# Patient Record
Sex: Male | Born: 1955 | Race: White | Hispanic: No | Marital: Single | Smoking: Never smoker
Health system: Southern US, Community
[De-identification: ages and names within clinical notes are randomized; demographics above are authoritative.]

## PROBLEM LIST (undated history)

## (undated) ENCOUNTER — Emergency Department (HOSPITAL_COMMUNITY): Admission: EM | Payer: Non-veteran care | Source: Home / Self Care

## (undated) DIAGNOSIS — I10 Essential (primary) hypertension: Secondary | ICD-10-CM

## (undated) DIAGNOSIS — C159 Malignant neoplasm of esophagus, unspecified: Secondary | ICD-10-CM

---

## 2005-02-22 ENCOUNTER — Emergency Department (HOSPITAL_COMMUNITY): Admission: EM | Admit: 2005-02-22 | Discharge: 2005-02-22 | Payer: Self-pay | Admitting: Emergency Medicine

## 2005-02-24 ENCOUNTER — Emergency Department (HOSPITAL_COMMUNITY): Admission: EM | Admit: 2005-02-24 | Discharge: 2005-02-25 | Payer: Self-pay | Admitting: Emergency Medicine

## 2008-12-09 ENCOUNTER — Emergency Department (HOSPITAL_COMMUNITY): Admission: EM | Admit: 2008-12-09 | Discharge: 2008-12-09 | Payer: Self-pay | Admitting: Emergency Medicine

## 2010-07-04 LAB — CBC
HCT: 34.4 % — ABNORMAL LOW (ref 39.0–52.0)
Hemoglobin: 11.9 g/dL — ABNORMAL LOW (ref 13.0–17.0)
MCHC: 34.5 g/dL (ref 30.0–36.0)
MCV: 91.6 fL (ref 78.0–100.0)
Platelets: 280 10*3/uL (ref 150–400)
RBC: 3.75 MIL/uL — ABNORMAL LOW (ref 4.22–5.81)
RDW: 13.2 % (ref 11.5–15.5)
WBC: 18.1 10*3/uL — ABNORMAL HIGH (ref 4.0–10.5)

## 2010-07-04 LAB — URINALYSIS, ROUTINE W REFLEX MICROSCOPIC
Bilirubin Urine: NEGATIVE
Glucose, UA: 100 mg/dL — AB
Hgb urine dipstick: NEGATIVE
Ketones, ur: NEGATIVE mg/dL
Nitrite: NEGATIVE
Protein, ur: 30 mg/dL — AB
Specific Gravity, Urine: 1.02 (ref 1.005–1.030)
Urobilinogen, UA: 0.2 mg/dL (ref 0.0–1.0)
pH: 5.5 (ref 5.0–8.0)

## 2010-07-04 LAB — URINE MICROSCOPIC-ADD ON

## 2010-07-04 LAB — COMPREHENSIVE METABOLIC PANEL
ALT: 54 U/L — ABNORMAL HIGH (ref 0–53)
AST: 32 U/L (ref 0–37)
Albumin: 3.2 g/dL — ABNORMAL LOW (ref 3.5–5.2)
Alkaline Phosphatase: 102 U/L (ref 39–117)
BUN: 32 mg/dL — ABNORMAL HIGH (ref 6–23)
CO2: 26 mEq/L (ref 19–32)
Calcium: 8.8 mg/dL (ref 8.4–10.5)
Chloride: 95 mEq/L — ABNORMAL LOW (ref 96–112)
Creatinine, Ser: 3.42 mg/dL — ABNORMAL HIGH (ref 0.4–1.5)
GFR calc Af Amer: 23 mL/min — ABNORMAL LOW (ref >60)
GFR calc non Af Amer: 19 mL/min — ABNORMAL LOW (ref >60)
Glucose, Bld: 124 mg/dL — ABNORMAL HIGH (ref 70–99)
Potassium: 3.5 mEq/L (ref 3.5–5.1)
Sodium: 132 mEq/L — ABNORMAL LOW (ref 135–145)
Total Bilirubin: 0.6 mg/dL (ref 0.3–1.2)
Total Protein: 6.9 g/dL (ref 6.0–8.3)

## 2010-07-04 LAB — DIFFERENTIAL
Basophils Absolute: 0 10*3/uL (ref 0.0–0.1)
Basophils Relative: 0 % (ref 0–1)
Eosinophils Absolute: 0 10*3/uL (ref 0.0–0.7)
Eosinophils Relative: 0 % (ref 0–5)
Lymphocytes Relative: 8 % — ABNORMAL LOW (ref 12–46)
Lymphs Abs: 1.4 10*3/uL (ref 0.7–4.0)
Monocytes Absolute: 1.9 10*3/uL — ABNORMAL HIGH (ref 0.1–1.0)
Monocytes Relative: 11 % (ref 3–12)
Neutro Abs: 14.8 10*3/uL — ABNORMAL HIGH (ref 1.7–7.7)
Neutrophils Relative %: 81 % — ABNORMAL HIGH (ref 43–77)

## 2016-03-30 HISTORY — PX: PROSTATECTOMY: SHX69

## 2018-02-27 HISTORY — PX: PEG PLACEMENT: SHX5437

## 2018-03-07 NOTE — Progress Notes (Signed)
GI Location of Tumor / Histology: Esophagus  Drew Vega presented with complaints of new onset of difficulty/trouble swallowing that began 6 weeks ago that he did not have before that occurs daily.  He described difficulties swallowing solids only, not liquids, particularly bread and meats.  Solids get stuck at his distal esophagus that requires regurgitation to clear.  He denies any difficulty swallowing liquids.  EGD 02/17/2018: malignant mass extending from the GE junction to 35 cm, very necrotic appearing, sig luminal narrowing but able to pass scope, mass possible submucosal in cardia appears extension.  Colonoscopy 09/2007: Perianal and digital rectal examinations were normal.  One 5 mm sessile sigmoid colon polyp, resected/retrieved.  Two 6-8 mm sessile sigmoid/distal sigmoid colon polyps, resected/retreived.    Biopsies of Gastric Cardia and Esophagus mass 02/17/2018  A. Gastric Cardia Mass, biopsies -Poorly differentiated adenocarcinoma, at least intramucosal. -Chronic active gastritis; bacteria morphologically consistent with H. Pylori. B. Esophageal mass, biopsies: -Gastric type mucosa with foci of poorly differentiated adenocarcinoma, at least intramucosal. -fragments of gastric type mucosa with chronic active gastritis. -necro-inflammatory debris with bacteria. -one fragment of squamous mucosa with parakeratosis.  Past/Anticipated interventions by GI, if any: Dr. Fonnie Birkenhead 02/01/2018 -Upper GI endoscopy examination secondary to dysphagia. -Colonoscopy examination for colorectal cancer screening.   Previous -Bowel prep for the colonoscopy 09/2007 was fair.  Reccommed repeat colonoscopy in 3 years because of poor bowel preparation and for surveillance co colon polyps.  Repeat colonoscopy was not done. -EGD done 02/17/2018  Past/Anticipated interventions by surgeon, if any:   Past/Anticipated interventions by medical oncology, if any:   Weight changes, if any: Roughly 25  pounds down in about 8 weeks.  Supplementing with protein shakes  Bowel/Bladder complaints, if any: No bowel movement in about 5 days.  Nausea / Vomiting, if any: Not now, with diet change.  Limiting the amount he takes in at a time.  Pain issues, if any: No  Any blood per rectum: No  BP 130/86 (BP Location: Left Arm, Patient Position: Sitting)   Pulse 82   Temp 98.8 F (37.1 C) (Oral)   Resp 18   Ht 5\' 7"  (1.702 m)   Wt 160 lb 2 oz (72.6 kg)   SpO2 100%   BMI 25.08 kg/m    Wt Readings from Last 3 Encounters:  03/08/18 160 lb 2 oz (72.6 kg)   SAFETY ISSUES:  Prior radiation? No  Pacemaker/ICD? No  Possible current pregnancy? No  Is the patient on methotrexate? No  Current Complaints/Details:

## 2018-03-08 ENCOUNTER — Encounter: Payer: Self-pay | Admitting: Radiation Oncology

## 2018-03-08 ENCOUNTER — Other Ambulatory Visit: Payer: Self-pay | Admitting: Radiation Oncology

## 2018-03-08 ENCOUNTER — Ambulatory Visit
Admission: RE | Admit: 2018-03-08 | Discharge: 2018-03-08 | Disposition: A | Discharge status: Home / Self Care | Payer: No Typology Code available for payment source | Source: Ambulatory Visit | Attending: Radiation Oncology | Admitting: Radiation Oncology

## 2018-03-08 ENCOUNTER — Other Ambulatory Visit: Payer: Self-pay

## 2018-03-08 VITALS — BP 130/86 | HR 82 | Temp 98.8°F | Resp 18 | Ht 67.0 in | Wt 160.1 lb

## 2018-03-08 DIAGNOSIS — Z79899 Other long term (current) drug therapy: Secondary | ICD-10-CM | POA: Insufficient documentation

## 2018-03-08 DIAGNOSIS — C155 Malignant neoplasm of lower third of esophagus: Secondary | ICD-10-CM

## 2018-03-08 DIAGNOSIS — Z7982 Long term (current) use of aspirin: Secondary | ICD-10-CM | POA: Insufficient documentation

## 2018-03-08 NOTE — Progress Notes (Signed)
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   HISTORY OF PRESENT ILLNESS: Drew Vega is a 62 y.o. male seen at the request of Dr. Fonnie Birkenhead in GI at the [place] [facility] in [place] for a newly diagnosed esophageal cancer. The patient has been having dysphagia and odynophagia for the last 6 weeks, and has lost about 25 pounds in the last two months. He underwent endoscopy and colonoscopy on 02/17/18. He had a benign appearing colon polyp, but on EGD had a large, mass in the esphagus that was necrotic appearing and significant luminal narrowing was noted. There was extension into the gastric cardia. A biopsy revealed a poorly differentiated adenocarcinoma. He underwent a CT C/A/P on 03/03/18 that revealed a 5.2 x 5.4 cm mass in the distal eosphagus and extends to the gastric cardia. It narrows the lumen of the esophagus, and the esophagus proximal to the tumor measures up to 3.3 cm. There was a thoracic ectasia measuring 4 x 3.9 cm, and changes in the left chest that appear to be consistent with prior service related injury with metal fragments in the chest wall and lung. There was a 3.4 x 3.7 x 4 cm gastrohepatic necrotic node. There were also numerous hypoenhancing liver lesions many appearing necrotic and measure up to 2.1 x 2.3 cm in the right lobe. He has a right kidney stone and left renal cysts noted. He comes today to discuss options of treatment for the cancer. He did have to undergo repeat endoscopy on 03/04/18 due to retained food and regurgitation.   PREVIOUS RADIATION THERAPY: No   PAST MEDICAL HISTORY: History reviewed. No pertinent past medical history.     PAST SURGICAL HISTORY: Past  Surgical History:  Procedure Laterality Date  . PROSTATECTOMY  2018     FAMILY HISTORY:  Family History  Problem Relation Age of Onset  . Throat cancer Mother      SOCIAL HISTORY:  reports that he has never smoked. He has never used smokeless tobacco. He reports that he drinks alcohol.   ALLERGIES: Patient has no known allergies.   MEDICATIONS:  Current Outpatient Medications  Medication Sig Dispense Refill  . AMLODIPINE BESYLATE PO Take 10 mg by mouth daily.    Marland Kitchen aspirin EC 81 MG tablet Take 81 mg by mouth daily.    Marland Kitchen atenolol (TENORMIN) 100 MG tablet Take 100 mg by mouth daily.    . chlorthalidone (HYGROTON) 25 MG tablet Take 25 mg by mouth daily. Take one half tablet daily.    Marland Kitchen doxepin (SINEQUAN) 25 MG capsule Take 25 mg by mouth. Take one capsule by mouth at bedtime along with melatonin for sleep.    Marland Kitchen lisinopril (PRINIVIL,ZESTRIL) 40 MG tablet Take 40 mg by mouth daily. Take one half tablet by mouth in the morning.    . Omega-3 Fatty Acids (FISH OIL) 1000 MG CAPS Take 1,000 mg by mouth daily.    Marland Kitchen omeprazole (PRILOSEC) 20 MG capsule Take 20 mg by mouth daily.    . sildenafil (VIAGRA) 100 MG tablet Take 100 mg by mouth daily as needed for erectile dysfunction.     No current facility-administered medications for this encounter.  REVIEW OF SYSTEMS: On review of systems, the patient reports that he is doing okay but is very fatigued, and has lost about 25 pounds of weight in the last 8 weeks. He denies any chest pain, shortness of breath, cough, fevers, chills, night sweats. He reports he is only able to swallow liquids at this time. He wishes to avoid a feeding tube if possible. He denies any bowel or bladder disturbances, and denies abdominal pain, nausea or vomiting. His last episode of regurgitation was prior to his endoscopy on 03/04/18. He denies any new musculoskeletal or joint aches or pains. A complete review of systems is obtained and is otherwise negative.      PHYSICAL EXAM:  Wt Readings from Last 3 Encounters:  03/08/18 160 lb 2 oz (72.6 kg)   Temp Readings from Last 3 Encounters:  03/08/18 98.8 F (37.1 C) (Oral)   BP Readings from Last 3 Encounters:  03/08/18 130/86   Pulse Readings from Last 3 Encounters:  03/08/18 82   Pain Assessment Pain Score: 0-No pain/10  In general this is a thin appearing caucasian male in no acute distress. He is alert and oriented x4 and appropriate throughout the examination. HEENT reveals that the patient is normocephalic, atraumatic. EOMs are intact. Skin is intact without any evidence of gross lesions. Cardiopulmonary assessment is negative for acute distress and he exhibits normal effort.    ECOG = 1  0 - Asymptomatic (Fully active, able to carry on all predisease activities without restriction)  1 - Symptomatic but completely ambulatory (Restricted in physically strenuous activity but ambulatory and able to carry out work of a light or sedentary nature. For example, light housework, office work)  2 - Symptomatic, <50% in bed during the day (Ambulatory and capable of all self care but unable to carry out any work activities. Up and about more than 50% of waking hours)  3 - Symptomatic, >50% in bed, but not bedbound (Capable of only limited self-care, confined to bed or chair 50% or more of waking hours)  4 - Bedbound (Completely disabled. Cannot carry on any self-care. Totally confined to bed or chair)  5 - Death   Drew Vega MM, Drew Vega, Drew Vega, et al. [id number]). "Toxicity and response criteria of the [facility]". [place] Oncol. 5 (6): 649-55    LABORATORY DATA:  Lab Results  Component Value Date   WBC 18.1 (H) 12/09/2008   HGB 11.9 (L) 12/09/2008   HCT 34.4 (L) 12/09/2008   MCV 91.6 12/09/2008   PLT 280 12/09/2008   Lab Results  Component Value Date   NA 132 (L) 12/09/2008   K 3.5 12/09/2008   CL 95 (L) 12/09/2008   CO2 26 12/09/2008   Lab Results   Component Value Date   ALT 54 (H) 12/09/2008   AST 32 12/09/2008   ALKPHOS 102 12/09/2008   BILITOT 0.6 12/09/2008      RADIOGRAPHY: No results found.     IMPRESSION/PLAN: 1. 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 Dr. Lisbeth Renshaw discusses the pathology findings and reviews the nature of esophageal cancers. We have ordered a PET scan to further evaluate for metastatic disease. I've sent a message to Dr. Neita Carp in medical oncology at the [facility] to determine if they desire additional imaging or biopsy to confirm the suspected liver disease. Dr. Lisbeth Renshaw reviews the rationale to consider palliative radiotherapy alone versus a 3-4 week course of palliative radiotherapy with ongoing chemotherapy. We  discussed the risks, benefits, short, and long term effects of radiotherapy, and the patient is interested in proceeding. Written consent is obtained and placed in the chart, a copy was provided to the patient. We will determine his overall course after speaking with Dr. Neita Carp. The patient is in agreement and  We will be in touch with him to coordinate simulation. 2. Weight loss. The patient is interested in avoiding a feeding tube at this time. We can revisit this if needed. The patient is in agreement.  In a visit lasting 60 minutes, greater than 50% of the time was spent face to face discussing his case, and coordinating the patient's care.  The above documentation reflects my direct findings during this shared patient visit. Please see the separate note by Dr. Lisbeth Renshaw on this date for the remainder of the patient's plan of care.    Carola Rhine, PAC

## 2018-03-09 ENCOUNTER — Telehealth: Payer: Self-pay | Admitting: *Deleted

## 2018-03-09 NOTE — Telephone Encounter (Signed)
CALLED PATIENT TO INFORM OF PET SCAN FOR 03-21-18 - ARRIVAL TIME- 11:30 AM @ [facility], PT. TO BE NPO- 6 HRS. PRIOR TO TEST, SPOKE WITH PATIENT AND HE IS AWARE OF THIS TEST

## 2018-03-11 ENCOUNTER — Ambulatory Visit
Admission: RE | Admit: 2018-03-11 | Discharge: 2018-03-11 | Disposition: A | Discharge status: Home / Self Care | Payer: Self-pay | Source: Ambulatory Visit | Attending: Radiation Oncology | Admitting: Radiation Oncology

## 2018-03-11 ENCOUNTER — Telehealth: Payer: Self-pay | Admitting: *Deleted

## 2018-03-11 ENCOUNTER — Other Ambulatory Visit: Payer: Self-pay | Admitting: Radiation Oncology

## 2018-03-11 DIAGNOSIS — C159 Malignant neoplasm of esophagus, unspecified: Secondary | ICD-10-CM

## 2018-03-11 NOTE — Telephone Encounter (Signed)
Called patient to inform of Pet Scan being moved to 03-15-18 - arrival time - 9 am @ [facility], pt. to report to [facility], pt. to be NPO- 6 hrs. Prior to test, spoke with patient and he is aware of this test

## 2018-03-14 ENCOUNTER — Ambulatory Visit
Admission: RE | Admit: 2018-03-14 | Discharge: 2018-03-14 | Disposition: A | Discharge status: Home / Self Care | Payer: No Typology Code available for payment source | Source: Ambulatory Visit | Attending: Radiation Oncology | Admitting: Radiation Oncology

## 2018-03-14 DIAGNOSIS — Z510 Encounter for antineoplastic radiation therapy: Secondary | ICD-10-CM | POA: Insufficient documentation

## 2018-03-14 DIAGNOSIS — C155 Malignant neoplasm of lower third of esophagus: Secondary | ICD-10-CM | POA: Insufficient documentation

## 2018-03-15 ENCOUNTER — Ambulatory Visit
Admission: RE | Admit: 2018-03-15 | Discharge: 2018-03-15 | Disposition: A | Discharge status: Home / Self Care | Payer: No Typology Code available for payment source | Source: Ambulatory Visit | Attending: Radiation Oncology | Admitting: Radiation Oncology

## 2018-03-15 ENCOUNTER — Telehealth: Payer: Self-pay | Admitting: Radiation Oncology

## 2018-03-15 DIAGNOSIS — R590 Localized enlarged lymph nodes: Secondary | ICD-10-CM | POA: Insufficient documentation

## 2018-03-15 DIAGNOSIS — Z510 Encounter for antineoplastic radiation therapy: Secondary | ICD-10-CM | POA: Diagnosis not present

## 2018-03-15 DIAGNOSIS — N200 Calculus of kidney: Secondary | ICD-10-CM | POA: Diagnosis not present

## 2018-03-15 DIAGNOSIS — I700 Atherosclerosis of aorta: Secondary | ICD-10-CM | POA: Insufficient documentation

## 2018-03-15 DIAGNOSIS — C155 Malignant neoplasm of lower third of esophagus: Secondary | ICD-10-CM | POA: Diagnosis not present

## 2018-03-15 DIAGNOSIS — I712 Thoracic aortic aneurysm, without rupture: Secondary | ICD-10-CM | POA: Insufficient documentation

## 2018-03-15 LAB — GLUCOSE, CAPILLARY: Glucose-Capillary: 89 mg/dL (ref 70–99)

## 2018-03-15 MED ORDER — FLUDEOXYGLUCOSE F - 18 (FDG) INJECTION
8.6100 | Freq: Once | INTRAVENOUS | Status: AC | PRN
  Administered 2018-03-15: 8.61 via INTRAVENOUS

## 2018-03-15 NOTE — Telephone Encounter (Signed)
I called the patient to let him results of his PET imaging. He will proceed with palliative radiotherapy. He will start tomorrow and then go on to chemotherapy following this. He was asked for authorization for his PET at [facility] when he arrived. I'll ask our staff to communicate with nuc med.

## 2018-03-16 ENCOUNTER — Ambulatory Visit
Admission: RE | Admit: 2018-03-16 | Discharge: 2018-03-16 | Disposition: A | Discharge status: Home / Self Care | Payer: No Typology Code available for payment source | Source: Ambulatory Visit | Attending: Radiation Oncology | Admitting: Radiation Oncology

## 2018-03-16 DIAGNOSIS — Z510 Encounter for antineoplastic radiation therapy: Secondary | ICD-10-CM | POA: Diagnosis not present

## 2018-03-17 ENCOUNTER — Ambulatory Visit
Admission: RE | Admit: 2018-03-17 | Discharge: 2018-03-17 | Disposition: A | Discharge status: Home / Self Care | Payer: No Typology Code available for payment source | Source: Ambulatory Visit | Attending: Radiation Oncology | Admitting: Radiation Oncology

## 2018-03-17 DIAGNOSIS — C155 Malignant neoplasm of lower third of esophagus: Secondary | ICD-10-CM

## 2018-03-17 DIAGNOSIS — Z510 Encounter for antineoplastic radiation therapy: Secondary | ICD-10-CM | POA: Diagnosis not present

## 2018-03-17 MED ORDER — SONAFINE EX EMUL
1.0000 "application " | Freq: Once | CUTANEOUS | Status: AC
  Administered 2018-03-17: 1 via TOPICAL

## 2018-03-17 NOTE — Progress Notes (Signed)
Pt here for patient teaching.  Pt given Radiation and You booklet, skin care instructions and Sonafine.  Reviewed areas of pertinence such as fatigue, hair loss, skin changes and throat changes . Pt able to give teach back of to pat skin,apply Sonafine bid and avoid applying anything to skin within 4 hours of treatment. Pt verbalizes understanding of information given and will contact nursing with any questions or concerns.    Amenda Duclos M. Margalit Leece RN, BSN      

## 2018-03-18 ENCOUNTER — Ambulatory Visit
Admission: RE | Admit: 2018-03-18 | Discharge: 2018-03-18 | Disposition: A | Discharge status: Home / Self Care | Payer: No Typology Code available for payment source | Source: Ambulatory Visit | Attending: Radiation Oncology | Admitting: Radiation Oncology

## 2018-03-18 DIAGNOSIS — Z510 Encounter for antineoplastic radiation therapy: Secondary | ICD-10-CM | POA: Diagnosis not present

## 2018-03-18 NOTE — Progress Notes (Signed)
  Radiation Oncology         (336) [phone] ________________________________  Name: Drew Vega MRN: [id number]  Date: 03/14/2018  DOB: Aug 08, 1955  SIMULATION AND TREATMENT PLANNING NOTE  DIAGNOSIS:     ICD-10-CM   1. Primary adenocarcinoma of distal third of esophagus ([place]) C15.5      Site:  esophagus  NARRATIVE:  The patient was brought to the [facility].  Identity was confirmed.  All relevant records and images related to the planned course of therapy were reviewed.   Written consent to proceed with treatment was confirmed which was freely given after reviewing the details related to the planned course of therapy had been reviewed with the patient.  Then, the patient was set-up in a stable reproducible  supine position for radiation therapy.  CT images were obtained.  Surface markings were placed.    Medically necessary complex treatment device(s) for immobilization:  Vac-lock bag.   The CT images were loaded into the planning software.  Then the target and avoidance structures were contoured.  Treatment planning then occurred.  The radiation prescription was entered and confirmed.  A total of 2 complex treatment devices were fabricated which relate to the designed radiation treatment fields. Each of these customized fields/ complex treatment devices will be used on a daily basis during the radiation course. I have requested : Intensity Modulated Radiotherapy (IMRT) is medically necessary for this case for the following reason:  critcal adjacent normal structures including the heart and spinal cord.   PLAN:  The patient will receive 39 Gy in 13 fractions.  ________________________________   Jodelle Gross, MD, PhD

## 2018-03-21 ENCOUNTER — Ambulatory Visit (HOSPITAL_COMMUNITY): Payer: Self-pay

## 2018-03-21 ENCOUNTER — Ambulatory Visit
Admission: RE | Admit: 2018-03-21 | Discharge: 2018-03-21 | Disposition: A | Discharge status: Home / Self Care | Payer: No Typology Code available for payment source | Source: Ambulatory Visit | Attending: Radiation Oncology | Admitting: Radiation Oncology

## 2018-03-21 DIAGNOSIS — Z510 Encounter for antineoplastic radiation therapy: Secondary | ICD-10-CM | POA: Diagnosis not present

## 2018-03-22 ENCOUNTER — Ambulatory Visit
Admission: RE | Admit: 2018-03-22 | Discharge: 2018-03-22 | Disposition: A | Discharge status: Home / Self Care | Payer: No Typology Code available for payment source | Source: Ambulatory Visit | Attending: Radiation Oncology | Admitting: Radiation Oncology

## 2018-03-22 DIAGNOSIS — Z510 Encounter for antineoplastic radiation therapy: Secondary | ICD-10-CM | POA: Diagnosis not present

## 2018-03-24 ENCOUNTER — Ambulatory Visit
Admission: RE | Admit: 2018-03-24 | Discharge: 2018-03-24 | Disposition: A | Discharge status: Home / Self Care | Payer: No Typology Code available for payment source | Source: Ambulatory Visit | Attending: Radiation Oncology | Admitting: Radiation Oncology

## 2018-03-24 DIAGNOSIS — Z510 Encounter for antineoplastic radiation therapy: Secondary | ICD-10-CM | POA: Diagnosis not present

## 2018-03-25 ENCOUNTER — Ambulatory Visit
Admission: RE | Admit: 2018-03-25 | Discharge: 2018-03-25 | Disposition: A | Discharge status: Home / Self Care | Payer: No Typology Code available for payment source | Source: Ambulatory Visit | Attending: Radiation Oncology | Admitting: Radiation Oncology

## 2018-03-25 DIAGNOSIS — Z510 Encounter for antineoplastic radiation therapy: Secondary | ICD-10-CM | POA: Diagnosis not present

## 2018-03-28 ENCOUNTER — Ambulatory Visit
Admission: RE | Admit: 2018-03-28 | Discharge: 2018-03-28 | Disposition: A | Discharge status: Home / Self Care | Payer: No Typology Code available for payment source | Source: Ambulatory Visit | Attending: Radiation Oncology | Admitting: Radiation Oncology

## 2018-03-28 DIAGNOSIS — Z510 Encounter for antineoplastic radiation therapy: Secondary | ICD-10-CM | POA: Diagnosis not present

## 2018-03-29 ENCOUNTER — Ambulatory Visit
Admission: RE | Admit: 2018-03-29 | Discharge: 2018-03-29 | Disposition: A | Discharge status: Home / Self Care | Payer: No Typology Code available for payment source | Source: Ambulatory Visit | Attending: Radiation Oncology | Admitting: Radiation Oncology

## 2018-03-29 DIAGNOSIS — Z510 Encounter for antineoplastic radiation therapy: Secondary | ICD-10-CM | POA: Diagnosis not present

## 2018-03-31 ENCOUNTER — Ambulatory Visit
Admission: RE | Admit: 2018-03-31 | Discharge: 2018-03-31 | Disposition: A | Discharge status: Home / Self Care | Payer: No Typology Code available for payment source | Source: Ambulatory Visit | Attending: Radiation Oncology | Admitting: Radiation Oncology

## 2018-03-31 DIAGNOSIS — Z510 Encounter for antineoplastic radiation therapy: Secondary | ICD-10-CM | POA: Diagnosis not present

## 2018-03-31 DIAGNOSIS — C155 Malignant neoplasm of lower third of esophagus: Secondary | ICD-10-CM | POA: Diagnosis not present

## 2018-04-01 ENCOUNTER — Ambulatory Visit: Payer: No Typology Code available for payment source

## 2018-04-04 ENCOUNTER — Ambulatory Visit
Admission: RE | Admit: 2018-04-04 | Discharge: 2018-04-04 | Disposition: A | Discharge status: Home / Self Care | Payer: No Typology Code available for payment source | Source: Ambulatory Visit | Attending: Radiation Oncology | Admitting: Radiation Oncology

## 2018-04-04 DIAGNOSIS — Z510 Encounter for antineoplastic radiation therapy: Secondary | ICD-10-CM | POA: Diagnosis not present

## 2018-04-05 ENCOUNTER — Ambulatory Visit: Payer: No Typology Code available for payment source

## 2018-04-05 ENCOUNTER — Ambulatory Visit
Admission: RE | Admit: 2018-04-05 | Discharge: 2018-04-05 | Disposition: A | Discharge status: Home / Self Care | Payer: No Typology Code available for payment source | Source: Ambulatory Visit | Attending: Radiation Oncology | Admitting: Radiation Oncology

## 2018-04-05 DIAGNOSIS — Z510 Encounter for antineoplastic radiation therapy: Secondary | ICD-10-CM | POA: Diagnosis not present

## 2018-04-06 ENCOUNTER — Encounter: Payer: Self-pay | Admitting: Radiation Oncology

## 2018-04-06 ENCOUNTER — Ambulatory Visit
Admission: RE | Admit: 2018-04-06 | Discharge: 2018-04-06 | Disposition: A | Discharge status: Home / Self Care | Payer: No Typology Code available for payment source | Source: Ambulatory Visit | Attending: Radiation Oncology | Admitting: Radiation Oncology

## 2018-04-06 DIAGNOSIS — Z510 Encounter for antineoplastic radiation therapy: Secondary | ICD-10-CM | POA: Diagnosis not present

## 2018-04-07 ENCOUNTER — Other Ambulatory Visit: Payer: Self-pay

## 2018-04-07 ENCOUNTER — Inpatient Hospital Stay (HOSPITAL_COMMUNITY)
Admission: EM | Admit: 2018-04-07 | Discharge: 2018-04-30 | DRG: 380 | Disposition: E | Payer: No Typology Code available for payment source | Attending: Pulmonary Disease | Admitting: Pulmonary Disease

## 2018-04-07 ENCOUNTER — Emergency Department (HOSPITAL_COMMUNITY): Payer: No Typology Code available for payment source

## 2018-04-07 ENCOUNTER — Encounter (HOSPITAL_COMMUNITY): Payer: Self-pay

## 2018-04-07 DIAGNOSIS — Z515 Encounter for palliative care: Secondary | ICD-10-CM | POA: Diagnosis not present

## 2018-04-07 DIAGNOSIS — G8194 Hemiplegia, unspecified affecting left nondominant side: Secondary | ICD-10-CM | POA: Diagnosis not present

## 2018-04-07 DIAGNOSIS — I4901 Ventricular fibrillation: Secondary | ICD-10-CM | POA: Diagnosis not present

## 2018-04-07 DIAGNOSIS — N200 Calculus of kidney: Secondary | ICD-10-CM | POA: Diagnosis present

## 2018-04-07 DIAGNOSIS — I63431 Cerebral infarction due to embolism of right posterior cerebral artery: Secondary | ICD-10-CM | POA: Diagnosis not present

## 2018-04-07 DIAGNOSIS — R29724 NIHSS score 24: Secondary | ICD-10-CM | POA: Diagnosis not present

## 2018-04-07 DIAGNOSIS — R54 Age-related physical debility: Secondary | ICD-10-CM | POA: Diagnosis present

## 2018-04-07 DIAGNOSIS — R945 Abnormal results of liver function studies: Secondary | ICD-10-CM | POA: Diagnosis not present

## 2018-04-07 DIAGNOSIS — R471 Dysarthria and anarthria: Secondary | ICD-10-CM | POA: Diagnosis not present

## 2018-04-07 DIAGNOSIS — I63411 Cerebral infarction due to embolism of right middle cerebral artery: Secondary | ICD-10-CM | POA: Diagnosis not present

## 2018-04-07 DIAGNOSIS — A419 Sepsis, unspecified organism: Secondary | ICD-10-CM | POA: Diagnosis not present

## 2018-04-07 DIAGNOSIS — Z452 Encounter for adjustment and management of vascular access device: Secondary | ICD-10-CM

## 2018-04-07 DIAGNOSIS — Z931 Gastrostomy status: Secondary | ICD-10-CM

## 2018-04-07 DIAGNOSIS — Z7982 Long term (current) use of aspirin: Secondary | ICD-10-CM

## 2018-04-07 DIAGNOSIS — T465X6A Underdosing of other antihypertensive drugs, initial encounter: Secondary | ICD-10-CM | POA: Diagnosis present

## 2018-04-07 DIAGNOSIS — C787 Secondary malignant neoplasm of liver and intrahepatic bile duct: Secondary | ICD-10-CM | POA: Diagnosis not present

## 2018-04-07 DIAGNOSIS — Z9079 Acquired absence of other genital organ(s): Secondary | ICD-10-CM

## 2018-04-07 DIAGNOSIS — G92 Toxic encephalopathy: Secondary | ICD-10-CM | POA: Diagnosis not present

## 2018-04-07 DIAGNOSIS — I609 Nontraumatic subarachnoid hemorrhage, unspecified: Secondary | ICD-10-CM

## 2018-04-07 DIAGNOSIS — Z79899 Other long term (current) drug therapy: Secondary | ICD-10-CM

## 2018-04-07 DIAGNOSIS — E872 Acidosis, unspecified: Secondary | ICD-10-CM | POA: Diagnosis not present

## 2018-04-07 DIAGNOSIS — D649 Anemia, unspecified: Secondary | ICD-10-CM | POA: Diagnosis not present

## 2018-04-07 DIAGNOSIS — K922 Gastrointestinal hemorrhage, unspecified: Secondary | ICD-10-CM | POA: Diagnosis not present

## 2018-04-07 DIAGNOSIS — K2211 Ulcer of esophagus with bleeding: Secondary | ICD-10-CM | POA: Diagnosis not present

## 2018-04-07 DIAGNOSIS — Z6824 Body mass index (BMI) 24.0-24.9, adult: Secondary | ICD-10-CM

## 2018-04-07 DIAGNOSIS — I10 Essential (primary) hypertension: Secondary | ICD-10-CM | POA: Diagnosis present

## 2018-04-07 DIAGNOSIS — I6340 Cerebral infarction due to embolism of unspecified cerebral artery: Secondary | ICD-10-CM

## 2018-04-07 DIAGNOSIS — I472 Ventricular tachycardia: Secondary | ICD-10-CM | POA: Diagnosis not present

## 2018-04-07 DIAGNOSIS — K920 Hematemesis: Secondary | ICD-10-CM | POA: Diagnosis not present

## 2018-04-07 DIAGNOSIS — I951 Orthostatic hypotension: Secondary | ICD-10-CM | POA: Diagnosis present

## 2018-04-07 DIAGNOSIS — R55 Syncope and collapse: Secondary | ICD-10-CM | POA: Diagnosis present

## 2018-04-07 DIAGNOSIS — E875 Hyperkalemia: Secondary | ICD-10-CM | POA: Diagnosis present

## 2018-04-07 DIAGNOSIS — I4891 Unspecified atrial fibrillation: Secondary | ICD-10-CM | POA: Diagnosis not present

## 2018-04-07 DIAGNOSIS — Z7189 Other specified counseling: Secondary | ICD-10-CM | POA: Diagnosis not present

## 2018-04-07 DIAGNOSIS — K259 Gastric ulcer, unspecified as acute or chronic, without hemorrhage or perforation: Secondary | ICD-10-CM | POA: Diagnosis present

## 2018-04-07 DIAGNOSIS — D696 Thrombocytopenia, unspecified: Secondary | ICD-10-CM | POA: Diagnosis present

## 2018-04-07 DIAGNOSIS — I6330 Cerebral infarction due to thrombosis of unspecified cerebral artery: Secondary | ICD-10-CM

## 2018-04-07 DIAGNOSIS — C779 Secondary and unspecified malignant neoplasm of lymph node, unspecified: Secondary | ICD-10-CM | POA: Diagnosis present

## 2018-04-07 DIAGNOSIS — C155 Malignant neoplasm of lower third of esophagus: Secondary | ICD-10-CM | POA: Diagnosis not present

## 2018-04-07 DIAGNOSIS — I462 Cardiac arrest due to underlying cardiac condition: Secondary | ICD-10-CM | POA: Diagnosis not present

## 2018-04-07 DIAGNOSIS — Z789 Other specified health status: Secondary | ICD-10-CM

## 2018-04-07 DIAGNOSIS — R7989 Other specified abnormal findings of blood chemistry: Secondary | ICD-10-CM | POA: Diagnosis present

## 2018-04-07 DIAGNOSIS — J9600 Acute respiratory failure, unspecified whether with hypoxia or hypercapnia: Secondary | ICD-10-CM | POA: Diagnosis not present

## 2018-04-07 DIAGNOSIS — D62 Acute posthemorrhagic anemia: Secondary | ICD-10-CM | POA: Diagnosis not present

## 2018-04-07 DIAGNOSIS — Z66 Do not resuscitate: Secondary | ICD-10-CM | POA: Diagnosis not present

## 2018-04-07 DIAGNOSIS — Z9221 Personal history of antineoplastic chemotherapy: Secondary | ICD-10-CM

## 2018-04-07 DIAGNOSIS — E861 Hypovolemia: Secondary | ICD-10-CM | POA: Diagnosis present

## 2018-04-07 DIAGNOSIS — E871 Hypo-osmolality and hyponatremia: Secondary | ICD-10-CM | POA: Diagnosis present

## 2018-04-07 DIAGNOSIS — R579 Shock, unspecified: Secondary | ICD-10-CM | POA: Diagnosis present

## 2018-04-07 DIAGNOSIS — Z923 Personal history of irradiation: Secondary | ICD-10-CM

## 2018-04-07 DIAGNOSIS — K625 Hemorrhage of anus and rectum: Secondary | ICD-10-CM | POA: Diagnosis present

## 2018-04-07 DIAGNOSIS — I63512 Cerebral infarction due to unspecified occlusion or stenosis of left middle cerebral artery: Secondary | ICD-10-CM | POA: Diagnosis not present

## 2018-04-07 DIAGNOSIS — I351 Nonrheumatic aortic (valve) insufficiency: Secondary | ICD-10-CM | POA: Diagnosis not present

## 2018-04-07 DIAGNOSIS — Z808 Family history of malignant neoplasm of other organs or systems: Secondary | ICD-10-CM

## 2018-04-07 DIAGNOSIS — K219 Gastro-esophageal reflux disease without esophagitis: Secondary | ICD-10-CM | POA: Diagnosis present

## 2018-04-07 DIAGNOSIS — I619 Nontraumatic intracerebral hemorrhage, unspecified: Secondary | ICD-10-CM

## 2018-04-07 DIAGNOSIS — I469 Cardiac arrest, cause unspecified: Secondary | ICD-10-CM | POA: Diagnosis not present

## 2018-04-07 DIAGNOSIS — R509 Fever, unspecified: Secondary | ICD-10-CM

## 2018-04-07 DIAGNOSIS — H518 Other specified disorders of binocular movement: Secondary | ICD-10-CM | POA: Diagnosis not present

## 2018-04-07 HISTORY — DX: Essential (primary) hypertension: I10

## 2018-04-07 HISTORY — DX: Malignant neoplasm of esophagus, unspecified: C15.9

## 2018-04-07 LAB — COMPREHENSIVE METABOLIC PANEL
ALK PHOS: 575 U/L — AB (ref 38–126)
ALT: 48 U/L — ABNORMAL HIGH (ref 0–44)
AST: 56 U/L — ABNORMAL HIGH (ref 15–41)
Albumin: 2.2 g/dL — ABNORMAL LOW (ref 3.5–5.0)
Anion gap: 10 (ref 5–15)
BUN: 24 mg/dL — ABNORMAL HIGH (ref 8–23)
CO2: 23 mmol/L (ref 22–32)
CREATININE: 0.92 mg/dL (ref 0.61–1.24)
Calcium: 7.6 mg/dL — ABNORMAL LOW (ref 8.9–10.3)
Chloride: 95 mmol/L — ABNORMAL LOW (ref 98–111)
GFR calc Af Amer: >60 mL/min (ref >60)
GFR calc non Af Amer: >60 mL/min (ref >60)
Glucose, Bld: 148 mg/dL — ABNORMAL HIGH (ref 70–99)
Potassium: 4 mmol/L (ref 3.5–5.1)
SODIUM: 128 mmol/L — AB (ref 135–145)
Total Bilirubin: 0.9 mg/dL (ref 0.3–1.2)
Total Protein: 5.4 g/dL — ABNORMAL LOW (ref 6.5–8.1)

## 2018-04-07 LAB — CBC WITH DIFFERENTIAL/PLATELET
Abs Immature Granulocytes: 1 10*3/uL — ABNORMAL HIGH (ref 0.00–0.07)
Abs Immature Granulocytes: 1.16 10*3/uL — ABNORMAL HIGH (ref 0.00–0.07)
BASOS ABS: 0 10*3/uL (ref 0.0–0.1)
Basophils Absolute: 0.1 10*3/uL (ref 0.0–0.1)
Basophils Relative: 0 %
Basophils Relative: 0 %
EOS ABS: 0.2 10*3/uL (ref 0.0–0.5)
EOS PCT: 1 %
Eosinophils Absolute: 0.1 10*3/uL (ref 0.0–0.5)
Eosinophils Relative: 0 %
HCT: 25.3 % — ABNORMAL LOW (ref 39.0–52.0)
HCT: 17.2 % — ABNORMAL LOW (ref 39.0–52.0)
Hemoglobin: 8.2 g/dL — ABNORMAL LOW (ref 13.0–17.0)
Hemoglobin: 5.5 g/dL — CL (ref 13.0–17.0)
IMMATURE GRANULOCYTES: 5 %
Immature Granulocytes: 5 %
Lymphocytes Relative: 4 %
Lymphocytes Relative: 4 %
Lymphs Abs: 0.8 10*3/uL (ref 0.7–4.0)
Lymphs Abs: 0.9 10*3/uL (ref 0.7–4.0)
MCH: 29.9 pg (ref 26.0–34.0)
MCH: 29.9 pg (ref 26.0–34.0)
MCHC: 32.4 g/dL (ref 30.0–36.0)
MCHC: 32 g/dL (ref 30.0–36.0)
MCV: 92.3 fL (ref 80.0–100.0)
MCV: 93.5 fL (ref 80.0–100.0)
Monocytes Absolute: 2.3 10*3/uL — ABNORMAL HIGH (ref 0.1–1.0)
Monocytes Absolute: 2 10*3/uL — ABNORMAL HIGH (ref 0.1–1.0)
Monocytes Relative: 10 %
Monocytes Relative: 9 %
NEUTROS PCT: 82 %
NRBC: 0 % (ref 0.0–0.2)
Neutro Abs: 18 10*3/uL — ABNORMAL HIGH (ref 1.7–7.7)
Neutro Abs: 18.3 10*3/uL — ABNORMAL HIGH (ref 1.7–7.7)
Neutrophils Relative %: 80 %
Platelets: 98 10*3/uL — ABNORMAL LOW (ref 150–400)
Platelets: 99 10*3/uL — ABNORMAL LOW (ref 150–400)
RBC: 2.74 MIL/uL — ABNORMAL LOW (ref 4.22–5.81)
RBC: 1.84 MIL/uL — AB (ref 4.22–5.81)
RDW: 13.6 % (ref 11.5–15.5)
RDW: 13.7 % (ref 11.5–15.5)
WBC: 22.3 10*3/uL — ABNORMAL HIGH (ref 4.0–10.5)
WBC: 22.5 10*3/uL — ABNORMAL HIGH (ref 4.0–10.5)
nRBC: 0 % (ref 0.0–0.2)

## 2018-04-07 LAB — CBC
HEMATOCRIT: 19.6 % — AB (ref 39.0–52.0)
Hemoglobin: 6.5 g/dL — CL (ref 13.0–17.0)
MCH: 30.7 pg (ref 26.0–34.0)
MCHC: 33.2 g/dL (ref 30.0–36.0)
MCV: 92.5 fL (ref 80.0–100.0)
Platelets: 88 10*3/uL — ABNORMAL LOW (ref 150–400)
RBC: 2.12 MIL/uL — AB (ref 4.22–5.81)
RDW: 13.8 % (ref 11.5–15.5)
WBC: 18.8 10*3/uL — ABNORMAL HIGH (ref 4.0–10.5)
nRBC: 0 % (ref 0.0–0.2)

## 2018-04-07 LAB — URINALYSIS, ROUTINE W REFLEX MICROSCOPIC
Bilirubin Urine: NEGATIVE
Glucose, UA: 50 mg/dL — AB
Ketones, ur: NEGATIVE mg/dL
Leukocytes, UA: NEGATIVE
Nitrite: NEGATIVE
Protein, ur: 30 mg/dL — AB
Specific Gravity, Urine: 1.021 (ref 1.005–1.030)
pH: 6 (ref 5.0–8.0)

## 2018-04-07 LAB — PROCALCITONIN: Procalcitonin: 1.84 ng/mL

## 2018-04-07 LAB — PREPARE RBC (CROSSMATCH)

## 2018-04-07 LAB — I-STAT CG4 LACTIC ACID, ED
Lactic Acid, Venous: 2.87 mmol/L (ref 0.5–1.9)
Lactic Acid, Venous: 2.84 mmol/L (ref 0.5–1.9)

## 2018-04-07 LAB — MRSA PCR SCREENING: MRSA BY PCR: NEGATIVE

## 2018-04-07 LAB — LACTIC ACID, PLASMA
Lactic Acid, Venous: 3.2 mmol/L (ref 0.5–1.9)
Lactic Acid, Venous: 1.7 mmol/L (ref 0.5–1.9)

## 2018-04-07 LAB — INFLUENZA PANEL BY PCR (TYPE A & B)
INFLAPCR: NEGATIVE
INFLBPCR: NEGATIVE

## 2018-04-07 LAB — PROTIME-INR
INR: 1.48
Prothrombin Time: 17.7 seconds — ABNORMAL HIGH (ref 11.4–15.2)

## 2018-04-07 LAB — POC OCCULT BLOOD, ED: Fecal Occult Bld: POSITIVE — AB

## 2018-04-07 LAB — ABO/RH: ABO/RH(D): A POS

## 2018-04-07 LAB — APTT: aPTT: 35 seconds (ref 24–36)

## 2018-04-07 MED ORDER — ONDANSETRON HCL 4 MG/2ML IJ SOLN
4.0000 mg | Freq: Once | INTRAMUSCULAR | Status: AC
  Administered 2018-04-07: 4 mg via INTRAVENOUS
  Filled 2018-04-07: qty 2

## 2018-04-07 MED ORDER — SODIUM CHLORIDE 0.9 % IV SOLN
2.0000 g | Freq: Once | INTRAVENOUS | Status: AC
  Administered 2018-04-07: 2 g via INTRAVENOUS
  Filled 2018-04-07: qty 2

## 2018-04-07 MED ORDER — ACETAMINOPHEN 160 MG/5ML PO SOLN
650.0000 mg | Freq: Once | ORAL | Status: AC
  Administered 2018-04-07: 650 mg
  Filled 2018-04-07: qty 20.3

## 2018-04-07 MED ORDER — PANTOPRAZOLE SODIUM 40 MG IV SOLR: 40.0000 mg | Freq: Two times a day (BID) | INTRAVENOUS | Status: DC

## 2018-04-07 MED ORDER — PANTOPRAZOLE SODIUM 40 MG IV SOLR: 40.0000 mg | Freq: Once | INTRAVENOUS | Status: DC

## 2018-04-07 MED ORDER — LACTATED RINGERS IV BOLUS
1000.0000 mL | Freq: Once | INTRAVENOUS | Status: AC
  Administered 2018-04-07: 1000 mL via INTRAVENOUS

## 2018-04-07 MED ORDER — METRONIDAZOLE IN NACL 5-0.79 MG/ML-% IV SOLN
500.0000 mg | Freq: Three times a day (TID) | INTRAVENOUS | Status: DC
  Administered 2018-04-07 – 2018-04-08 (×2): 500 mg via INTRAVENOUS
  Filled 2018-04-07 (×2): qty 100

## 2018-04-07 MED ORDER — IOPAMIDOL (ISOVUE-300) INJECTION 61%
INTRAVENOUS | Status: AC
  Filled 2018-04-07: qty 100

## 2018-04-07 MED ORDER — SODIUM CHLORIDE 0.9 % IV SOLN
8.0000 mg/h | INTRAVENOUS | Status: DC
  Administered 2018-04-07 – 2018-04-10 (×7): 8 mg/h via INTRAVENOUS
  Filled 2018-04-07 (×11): qty 80

## 2018-04-07 MED ORDER — ONDANSETRON HCL 4 MG PO TABS: 4.0000 mg | ORAL_TABLET | Freq: Four times a day (QID) | ORAL | Status: DC | PRN

## 2018-04-07 MED ORDER — ACETAMINOPHEN 650 MG RE SUPP: 650.0000 mg | Freq: Four times a day (QID) | RECTAL | Status: DC | PRN

## 2018-04-07 MED ORDER — SODIUM CHLORIDE 0.9% IV SOLUTION: Freq: Once | INTRAVENOUS | Status: DC

## 2018-04-07 MED ORDER — SODIUM CHLORIDE 0.9 % IV SOLN
2.0000 g | Freq: Two times a day (BID) | INTRAVENOUS | Status: DC
  Administered 2018-04-07 – 2018-04-10 (×6): 2 g via INTRAVENOUS
  Filled 2018-04-07 (×7): qty 2

## 2018-04-07 MED ORDER — VANCOMYCIN HCL IN DEXTROSE 1-5 GM/200ML-% IV SOLN
1000.0000 mg | Freq: Once | INTRAVENOUS | Status: AC
  Administered 2018-04-07: 1000 mg via INTRAVENOUS
  Filled 2018-04-07: qty 200

## 2018-04-07 MED ORDER — SODIUM CHLORIDE 0.9 % IV SOLN
INTRAVENOUS | Status: DC
  Administered 2018-04-07 – 2018-04-09 (×4): via INTRAVENOUS

## 2018-04-07 MED ORDER — ACETAMINOPHEN 325 MG PO TABS
650.0000 mg | ORAL_TABLET | Freq: Four times a day (QID) | ORAL | Status: DC | PRN
  Filled 2018-04-07: qty 2

## 2018-04-07 MED ORDER — SODIUM CHLORIDE (PF) 0.9 % IJ SOLN
INTRAMUSCULAR | Status: AC
  Filled 2018-04-07: qty 50

## 2018-04-07 MED ORDER — ACETAMINOPHEN 325 MG PO TABS: 650.0000 mg | ORAL_TABLET | Freq: Once | ORAL | Status: DC

## 2018-04-07 MED ORDER — FREE WATER
100.0000 mL | Freq: Three times a day (TID) | Status: DC
  Administered 2018-04-08 – 2018-04-10 (×7): 100 mL

## 2018-04-07 MED ORDER — IOPAMIDOL (ISOVUE-300) INJECTION 61%
100.0000 mL | Freq: Once | INTRAVENOUS | Status: AC | PRN
  Administered 2018-04-07: 100 mL via INTRAVENOUS

## 2018-04-07 MED ORDER — SODIUM CHLORIDE 0.9 % IV BOLUS
1000.0000 mL | Freq: Once | INTRAVENOUS | Status: AC
  Administered 2018-04-07: 1000 mL via INTRAVENOUS

## 2018-04-07 MED ORDER — VANCOMYCIN HCL IN DEXTROSE 750-5 MG/150ML-% IV SOLN
750.0000 mg | Freq: Two times a day (BID) | INTRAVENOUS | Status: DC
  Administered 2018-04-08 – 2018-04-09 (×4): 750 mg via INTRAVENOUS
  Filled 2018-04-07 (×5): qty 150

## 2018-04-07 MED ORDER — ONDANSETRON HCL 4 MG/2ML IJ SOLN
4.0000 mg | Freq: Four times a day (QID) | INTRAMUSCULAR | Status: DC | PRN
  Administered 2018-04-09: 4 mg via INTRAVENOUS
  Filled 2018-04-07: qty 2

## 2018-04-07 NOTE — Progress Notes (Signed)
Pharmacy Antibiotic Note  Drew Vega is a 63 y.o. male admitted on 04/15/2018 with sepsis.  Pharmacy has been consulted for vancomycin and cefepime dosing.  Plan:  Vancomycin 1000 mg IV now, then 750 mg IV q12 hr (est AUC 447 based on SCr 0.92)  Measure vancomycin AUC at steady state as indicated  Cefepime 2 g IV q12 hr  SCr 48 hr while on vanc     Temp (24hrs), Avg:100.8 F (38.2 C), Min:100.1 F (37.8 C), Max:101.5 F (38.6 C)  Recent Labs  Lab 04/21/2018 1136 04/28/2018 1144 04/26/2018 1252 04/25/2018 1628  WBC 22.3*  --   --  22.5*  CREATININE 0.92  --   --   --   LATICACIDVEN  --  2.87* 2.84*  --     CrCl cannot be calculated (Unknown ideal weight.).    No Known Allergies  Antimicrobials this admission: 1/9 Cefepime >>  1/9 Vanc >>   Dose adjustments this admission: n/a  Microbiology results: 1/9 BCx: sent 1/9 UCx: sent   Thank you for allowing pharmacy to be a part of this patient's care.  Reuel Boom, PharmD, BCPS [phone] 04/13/2018, 5:46 PM

## 2018-04-07 NOTE — H&P (View-Only) (Signed)
Referring Provider:  Dr. Ronnald Nian, EDP Primary Care Physician:  [facility] Primary Gastroenterologist:  [facility]  Reason for Consultation:  Melena, heme positive stool, esophageal cancer  HPI: Drew Vega is a 63 y.o. male with stage IV/metastatic esophageal cancer diagnosed in November 2019 at the [facility].  Just finished radiation treatments here at [facility] facilities with his last treatment yesterday.  He recently had a food impaction and as a result had a feeding tube placed about 3 weeks ago.  Apparently his wife has copies of his endoscopy reports, but she was not in the room at the time of our interview.  He presented to [facility] ED today with syncopal episode at home and one episode of hematemesis.  He tells me that he completed his feeding regimen and was tolerating that fine.  Later he began to feel nauseated and rushed to the sink at which time he vomited out a small quantity of bright red blood.  He then had a syncopal episode.  He is not taking anything by mouth except for water but denies any issues with that including pain.  He denies having melena at home, but reports that he "does not really look or any attention".  Is anemic with hemoglobin of 8.2 g and no labs for comparison.  Repeat hemoglobin this evening was down to 5.5 g.  At the time of my interview the repeat hemoglobin had not been resulted, and he had not yet received any packed red blood cells.  EDP reported  melena on rectal exam in the ED that was heme positive.  Has been hypotensive and mildly tachycardic.  Pale appearing.   History reviewed. No pertinent past medical history.  Past Surgical History:  Procedure Laterality Date  . PROSTATECTOMY  2018    Prior to Admission medications   Medication Sig Start Date End Date Taking? Authorizing Provider  amLODipine (NORVASC) 10 MG tablet Take 10 mg by mouth daily.   Yes [provider]  aspirin EC 81 MG tablet Take 81 mg by mouth daily.   Yes [provider]  atenolol (TENORMIN) 100 MG tablet Take 100 mg by mouth daily.   Yes [provider]  chlorthalidone (HYGROTON) 25 MG tablet Take 12.5 mg by mouth daily. Take one half tablet daily.    Yes [provider]  lisinopril (PRINIVIL,ZESTRIL) 40 MG tablet Take 20 mg by mouth daily.    Yes [provider]  Omega-3 Fatty Acids (FISH OIL) 1000 MG CAPS Take 1,000 mg by mouth daily.   Yes [provider]  omeprazole (PRILOSEC) 20 MG capsule Take 20 mg by mouth daily.   Yes [provider]  sildenafil (VIAGRA) 100 MG tablet Take 100 mg by mouth daily as needed for erectile dysfunction.   Yes [provider]    Current Facility-Administered Medications  Medication Dose Route Frequency Provider Last Rate Last Dose  . iopamidol (ISOVUE-300) 61 % injection           . lactated ringers bolus 1,000 mL  1,000 mL Intravenous Once Lennice Sites, DO 983.6 mL/hr at 04/06/2018 1615 1,000 mL at 04/14/2018 1615  . pantoprazole (PROTONIX) injection 40 mg  40 mg Intravenous Once Curatolo, Adam, DO      . sodium chloride (PF) 0.9 % injection            Current Outpatient Medications  Medication Sig Dispense Refill  . amLODipine (NORVASC) 10 MG tablet Take 10 mg by mouth daily.    Marland Kitchen  aspirin EC 81 MG tablet Take 81 mg by mouth daily.    Marland Kitchen atenolol (TENORMIN) 100 MG tablet Take 100 mg by mouth daily.    . chlorthalidone (HYGROTON) 25 MG tablet Take 12.5 mg by mouth daily. Take one half tablet daily.     Marland Kitchen lisinopril (PRINIVIL,ZESTRIL) 40 MG tablet Take 20 mg by mouth daily.     . Omega-3 Fatty Acids (FISH OIL) 1000 MG CAPS Take 1,000 mg by mouth daily.    Marland Kitchen omeprazole (PRILOSEC) 20 MG capsule Take 20 mg by mouth daily.    . sildenafil (VIAGRA) 100 MG tablet Take 100 mg by mouth daily as needed for erectile dysfunction.      Allergies as of 04/13/2018  . (No Known Allergies)    Family History  Problem Relation Age of Onset  . Throat cancer Mother      Social History   Socioeconomic History  . Marital status: Single    Spouse name: Not on file  . Number of children: Not on file  . Years of education: Not on file  . Highest education level: Not on file  Occupational History  . Not on file  Social Needs  . Financial resource strain: Not on file  . Food insecurity:    Worry: Not on file    Inability: Not on file  . Transportation needs:    Medical: No    Non-medical: No  Tobacco Use  . Smoking status: Never Smoker  . Smokeless tobacco: Never Used  Substance and Sexual Activity  . Alcohol use: Yes    Comment: occasional  . Drug use: Not on file  . Sexual activity: Not on file  Lifestyle  . Physical activity:    Days per week: Not on file    Minutes per session: Not on file  . Stress: Not on file  Relationships  . Social connections:    Talks on phone: Not on file    Gets together: Not on file    Attends religious service: Not on file    Active member of club or organization: Not on file    Attends meetings of clubs or organizations: Not on file    Relationship status: Not on file  . Intimate partner violence:    Fear of current or ex partner: Not on file    Emotionally abused: Not on file    Physically abused: Not on file    Forced sexual activity: Not on file  Other Topics Concern  . Not on file  Social History Narrative  . Not on file    Review of Systems: ROS is O/W negative except as mentioned in HPI.  Physical Exam: Vital signs in last 24 hours: Temp:  [100.1 F (37.8 C)-101.5 F (38.6 C)] 101.5 F (38.6 C) (01/09 1202) Pulse Rate:  [99-105] 99 (01/09 1430) Resp:  [18-25] 21 (01/09 1430) BP: (88-98)/(56-76) 90/60 (01/09 1430) SpO2:  [98 %-100 %] 100 % (01/09 1430)   General:  Alert, Well-developed, well-nourished, pleasant and cooperative in NAD; pale.  Body covered in tattoos. Head:  Normocephalic and atraumatic. Eyes:  Sclera clear, no icterus.  Conjunctiva pale. Ears:  Normal auditory  acuity. Mouth:  No deformity or lesions.   Lungs:  Clear throughout to auscultation.  No wheezes, crackles, or rhonchi.  Heart:  Regular rate and rhythm; no murmurs, clicks, rubs, or gallops. Abdomen:  Soft, non-distended.  BS present.  Non-tender.  PEG site noted. Rectal:  Deferred.  Heme positive in  the ED.  Msk:  Symmetrical without gross deformities. Pulses:  Normal pulses noted. Extremities:  Without clubbing or edema. Neurologic:  Alert and oriented x 4;  grossly normal neurologically. Skin:  Intact without significant lesions or rashes.  Body covered in tattoos. Psych:  Alert and cooperative. Normal mood and affect.  Intake/Output this shift: Total I/O In: 2000 [I.V.:2000] Out: 80 [Urine:80]  Lab Results: Recent Labs    04/19/2018 1136  WBC 22.3*  HGB 8.2*  HCT 25.3*  PLT 98*   BMET Recent Labs    04/23/2018 1136  NA 128*  K 4.0  CL 95*  CO2 23  GLUCOSE 148*  BUN 24*  CREATININE 0.92  CALCIUM 7.6*   LFT Recent Labs    04/01/2018 1136  PROT 5.4*  ALBUMIN 2.2*  AST 56*  ALT 48*  ALKPHOS 575*  BILITOT 0.9   Studies/Results: Dg Chest 2 View  Result Date: 04/17/2018 CLINICAL DATA:  Syncope with fall.  Reported esophageal carcinoma EXAM: CHEST - 2 VIEW COMPARISON:  None. FINDINGS: Port-A-Cath tip is at the cavoatrial junction. No pneumothorax. No edema or consolidation. Heart size and pulmonary vascularity are normal. No adenopathy. No acute fracture evident. Multiple metallic pellets are noted on the left. IMPRESSION: No edema or consolidation.  No adenopathy.  No mediastinal widening. Metallic pellets on the left noted. Port-A-Cath tip at cavoatrial junction. No pneumothorax. Electronically Signed   By: Lowella Grip III M.D.   On: 03/31/2018 12:11   Ct Head Wo Contrast  Result Date: 04/02/2018 CLINICAL DATA:  Syncope with fall EXAM: CT HEAD WITHOUT CONTRAST TECHNIQUE: Contiguous axial images were obtained from the base of the skull through the vertex without  intravenous contrast. COMPARISON:  None. FINDINGS: Brain: There is slight diffuse atrophy. There is no intracranial mass, hemorrhage, extra-axial fluid collection, or midline shift. The brain parenchyma appears unremarkable. There is no demonstrable acute infarct. Vascular: There is no hyperdense vessel. There is no appreciable vascular calcification. Skull: The bony calvarium appears intact. Sinuses/Orbits: There is mucosal thickening in several ethmoid air cells. Other paranasal sinuses are clear. There is rightward deviation of the nasal septum. Orbits appear symmetric bilaterally. Other: The mastoid air cells are clear. IMPRESSION: Slight diffuse atrophy. Brain parenchyma appears unremarkable. No mass or hemorrhage. There is mild mucosal thickening in several ethmoid air cells. There is deviation of the nasal septum toward the right. Electronically Signed   By: Lowella Grip III M.D.   On: 04/13/2018 12:10   Ct Abdomen Pelvis W Contrast  Result Date: 04/28/2018 CLINICAL DATA:  63 year old male with a history of esophageal cancer. Syncope EXAM: CT ABDOMEN AND PELVIS WITH CONTRAST TECHNIQUE: Multidetector CT imaging of the abdomen and pelvis was performed using the standard protocol following bolus administration of intravenous contrast. CONTRAST:  166mL ISOVUE-300 IOPAMIDOL (ISOVUE-300) INJECTION 61% COMPARISON:  12/09/2008, 02/25/2005 FINDINGS: Lower chest: Ground-glass opacity at the periphery of the right lower lobe continuous with the diaphragm. No pleural effusion. Abnormal soft tissue at the distal esophagus near the GE junction in this patient with a given history of esophageal carcinoma. Hepatobiliary: Current CT demonstrates multiple bilateral liver masses. The largest are within the right liver, at the segment 5/6 junction measuring 5.1 cm x 6.7 cm. None of these were present on the comparison CT exam. Trace intrahepatic biliary dilatation of the left without radiopaque stones. Gallbladder  relatively decompressed with no radiopaque stones. No extrahepatic biliary ductal dilatation or radiopaque stones. Pancreas: Unremarkable pancreas Spleen: Hypodense/hypoenhancing lesion at the periphery  of the spleen measuring 1.1 cm which has more uniform attenuation to spleen on the delayed images. This lesion not present on the comparison CT. Adrenals/Urinary Tract: Unremarkable adrenal glands Rounded stone within the right renal pelvis measuring 12 mm. This was not present on the comparison CT. No evidence of hydronephrosis. Mild inflammatory change/edema in the fat at the right renal pelvis. Unremarkable course of the right ureter with no additional stones. Subcentimeter hypodense cyst at the lateral cortex of the right kidney without evidence of enhancement. No evidence of left-sided hydronephrosis. Bosniak 2 cyst at the inferior left kidney. Unremarkable course of the left ureter. Urinary bladder is decompressed with circumferential wall thickening. Stomach/Bowel: Abnormal circumferential thickening of the distal esophagus with small nodularity within the adjacent fat extending through the hiatus. Balloon retention gastrostomy terminates within the stomach. Small bowel unremarkable without abnormal distention or focal thickening. No inflammatory changes. Normal appendix. Moderate stool burden without associated inflammation. No transition point or evidence of obstruction. Vascular/Lymphatic: Minimal atherosclerotic changes of the vasculature. No aneurysm. Small lymph nodes at the portacaval nodal station and at the liver hilum. No significant retroperitoneal adenopathy. Trace free fluid within the anatomic pelvis. Reproductive: Transverse diameter of the prostate estimated 19 mm Other: Small fat containing umbilical hernia Musculoskeletal: Degenerative changes of the visualized thoracolumbar spine. No significant bony canal narrowing. Multilevel degenerative disc disease degenerative changes of the bilateral  hips. IMPRESSION: No CT finding to account for syncope. 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 Irregular soft tissue thickening at the distal esophagus and GE junction, compatible with carcinoma in this patient with given history of esophageal carcinoma. Small nodularity within the adjacent fat and evidence of likely pathologic lymph nodes at the liver hilum. Hypodense lesion at the periphery of the spleen, potentially additional metastatic disease versus benign lesion such as infarct. 12 mm stone within the right renal pelvis with mild associated inflammation and no hydronephrosis. If there is concern for infection/pyelonephritis, recommend correlation with urinalysis. Appropriately positioned balloon retention percutaneous gastrostomy tube. Aortic Atherosclerosis (ICD10-I70.0). Trace free fluid in the anatomic pelvis, likely reactive. Electronically Signed   By: Corrie Mckusick D.O.   On: 04/04/2018 15:16   IMPRESSION:  *63 year old male with stage IV esophageal cancer diagnosed in November 2019 at the [facility].  Just finished radiation treatments here at [facility] facilities with his last treatment yesterday.  Presenting with syncopal episode at home and one episode of hematemesis.  Is anemic but no labs for comparison.  Reports have melena on rectal exam in the ED that was heme positive.  Has been hypotensive and mildly tachycardic.  Bleeding likely from tumor itself versus friable mucosa that is been worsened by recent radiation treatments.  PLAN: -Needs resuscitation with blood transfusion and IV fluids.  They are awaiting his packed red blood cells currently. -We will see the patient again in the morning and will likely plan for endoscopy pending his status.  Would hold tube feeds for now. -Continue to monitor hemoglobin and transfuse further prn. -Will start pantoprazole 40 mg IV BID.  **Addendum: Repeat hemoglobin this evening was found to be 5.5  g.   Janett Billow D. Zehr  04/22/2018, 4:46 PM    [facility] Attending   I have taken an interval history, reviewed the chart and examined the patient. I agree with the Advanced Practitioner's note, impression and recommendations.   Most likely bleeding from esophageal cancer/XRT damage. Needs blood, IVF Anticipate EGD tomorrow assuming he is adequately  resuscitated with blood and crystalloids.  Gatha Mayer, MD, [facility] 04/10/2018 9:05 PM Pager [phone]

## 2018-04-07 NOTE — H&P (Signed)
[facility] History and Physical  Drew Vega KZL:[id number] DOB: 13-Aug-1955 DOA: 04/10/2018   PCP: [facility]  Specialists: Followed by medical oncology at the [facility].  Radiation oncologist is Dr. Lisbeth Renshaw.  Chief Complaint: Passed out and threw up blood  HPI: Drew Vega is a 63 y.o. male with a past medical history of metastatic esophageal cancer who completed radiation treatment yesterday.  Plan was to initiate chemotherapy in the near future.  He received his radiation treatments here at the [facility] cancer center.  He was supposed to start chemotherapy at the [place] in [place].  Patient mentions that he was in his usual state of health up until earlier today when he woke up, felt well.  He had a PEG tube placed recently.  Started tube feedings about a week ago and has been going well.  He is doing bolus feeding regimen.  After he gave himself tube feedings he was sitting and then he felt nauseated.  He rushed to the sink and he vomited out a small quantity of blood.  And then he passed out.  He denies any chest pain or shortness of breath either before or after the episode.  He does have some right-sided abdominal pain.  Denies any dizziness or lightheadedness while lying in the bed currently.  He is only allowed water by mouth and has not felt any discomfort with swallowing water.  In the emergency department patient was noted to be hypotensive.  Blood work showed hypovolemia with hyponatremia.  He was also noted to be anemic.  Baseline hemoglobin is not known.  Patient also was noted to be febrile with a temperature of 101 F.  No obvious source of infection found.  He will need to be hospitalized for further management.  Home Medications: Prior to Admission medications   Medication Sig Start Date End Date Taking? Authorizing Provider  amLODipine (NORVASC) 10 MG tablet Take 10 mg by mouth daily.   Yes [provider]  aspirin EC 81 MG tablet Take 81 mg by mouth daily.    Yes [provider]  atenolol (TENORMIN) 100 MG tablet Take 100 mg by mouth daily.   Yes [provider]  chlorthalidone (HYGROTON) 25 MG tablet Take 12.5 mg by mouth daily. Take one half tablet daily.    Yes [provider]  lisinopril (PRINIVIL,ZESTRIL) 40 MG tablet Take 20 mg by mouth daily.    Yes [provider]  Omega-3 Fatty Acids (FISH OIL) 1000 MG CAPS Take 1,000 mg by mouth daily.   Yes [provider]  omeprazole (PRILOSEC) 20 MG capsule Take 20 mg by mouth daily.   Yes [provider]  sildenafil (VIAGRA) 100 MG tablet Take 100 mg by mouth daily as needed for erectile dysfunction.   Yes [provider]    Allergies: No Known Allergies  Past Medical History: History reviewed. No pertinent past medical history.  Past Surgical History:  Procedure Laterality Date  . PROSTATECTOMY  2018    Social History: Lives with his wife.  Occasional alcohol consumption.  No history of smoking.  Independent with daily activities.   Family History:  Family History  Problem Relation Age of Onset  . Throat cancer Mother      Review of Systems - History obtained from the patient General ROS: positive for  - fatigue and fever Psychological ROS: negative Ophthalmic ROS: negative ENT ROS: negative Allergy and Immunology ROS: negative Hematological and Lymphatic ROS: negative Endocrine ROS: negative Respiratory ROS: no  cough, shortness of breath, or wheezing Cardiovascular ROS: no chest pain or dyspnea on exertion Gastrointestinal ROS: positive for - abdominal pain Genito-Urinary ROS: no dysuria, trouble voiding, or hematuria Musculoskeletal ROS: negative Neurological ROS: no TIA or stroke symptoms Dermatological ROS: negative  Physical Examination  Vitals:   04/27/2018 1332 04/26/2018 1334 04/02/2018 1400 04/18/2018 1430  BP: (!) 89/56 97/61 93/76  90/60  Pulse: (!) 105 (!) 102 99 99  Resp:  (!) 23 (!) 21 (!) 21  Temp:        TempSrc:      SpO2: 98% 100% 100% 100%    BP 90/60   Pulse 99   Temp (!) 101.5 F (38.6 C) (Rectal)   Resp (!) 21   SpO2 100%   General appearance: alert, cooperative, appears stated age and no distress Head: Normocephalic, without obvious abnormality, atraumatic Eyes: conjunctivae/corneas clear. PERRL, EOM's intact.  Throat: lips, mucosa, and tongue normal; teeth and gums normal Neck: no adenopathy, no carotid bruit, no JVD, supple, symmetrical, trachea midline and thyroid not enlarged, symmetric, no tenderness/mass/nodules Resp: clear to auscultation bilaterally Cardio: regular rate and rhythm, S1, S2 normal, no murmur, click, rub or gallop and Port-A-Cath noted in the right chest.  No erythema. GI: Abdomen is soft.  Hepatomegaly appreciated which is tender.  No rebound rigidity or guarding.  No masses organomegaly.  PEG tube is noted. Extremities: extremities normal, atraumatic, no cyanosis or edema Pulses: 2+ and symmetric Skin: His entire body is covered with tattoos Lymph nodes: Cervical, supraclavicular, and axillary nodes normal. Neurologic: No focal neurological deficits.  He is alert and oriented x3.    Labs on Admission: I have personally reviewed following labs and imaging studies  CBC: Recent Labs  Lab 04/21/2018 1136  WBC 22.3*  NEUTROABS 18.0*  HGB 8.2*  HCT 25.3*  MCV 92.3  PLT 98*   Basic Metabolic Panel: Recent Labs  Lab 04/24/2018 1136  NA 128*  K 4.0  CL 95*  CO2 23  GLUCOSE 148*  BUN 24*  CREATININE 0.92  CALCIUM 7.6*   GFR: CrCl cannot be calculated (Unknown ideal weight.). Liver Function Tests: Recent Labs  Lab 04/23/2018 1136  AST 56*  ALT 48*  ALKPHOS 575*  BILITOT 0.9  PROT 5.4*  ALBUMIN 2.2*     Radiological Exams on Admission: Dg Chest 2 View  Result Date: 04/27/2018 CLINICAL DATA:  Syncope with fall.  Reported esophageal carcinoma EXAM: CHEST - 2 VIEW COMPARISON:  None. FINDINGS: Port-A-Cath tip is at the cavoatrial  junction. No pneumothorax. No edema or consolidation. Heart size and pulmonary vascularity are normal. No adenopathy. No acute fracture evident. Multiple metallic pellets are noted on the left. IMPRESSION: No edema or consolidation.  No adenopathy.  No mediastinal widening. Metallic pellets on the left noted. Port-A-Cath tip at cavoatrial junction. No pneumothorax. Electronically Signed   By: Lowella Grip III M.D.   On: 04/05/2018 12:11   Ct Head Wo Contrast  Result Date: 04/05/2018 CLINICAL DATA:  Syncope with fall EXAM: CT HEAD WITHOUT CONTRAST TECHNIQUE: Contiguous axial images were obtained from the base of the skull through the vertex without intravenous contrast. COMPARISON:  None. FINDINGS: Brain: There is slight diffuse atrophy. There is no intracranial mass, hemorrhage, extra-axial fluid collection, or midline shift. The brain parenchyma appears unremarkable. There is no demonstrable acute infarct. Vascular: There is no hyperdense vessel. There is no appreciable vascular calcification. Skull: The bony calvarium appears intact. Sinuses/Orbits: There is mucosal thickening in several ethmoid air cells.  Other paranasal sinuses are clear. There is rightward deviation of the nasal septum. Orbits appear symmetric bilaterally. Other: The mastoid air cells are clear. IMPRESSION: Slight diffuse atrophy. Brain parenchyma appears unremarkable. No mass or hemorrhage. There is mild mucosal thickening in several ethmoid air cells. There is deviation of the nasal septum toward the right. Electronically Signed   By: Lowella Grip III M.D.   On: 04/08/2018 12:10   Ct Abdomen Pelvis W Contrast  Result Date: 04/01/2018 CLINICAL DATA:  63 year old male with a history of esophageal cancer. Syncope EXAM: CT ABDOMEN AND PELVIS WITH CONTRAST TECHNIQUE: Multidetector CT imaging of the abdomen and pelvis was performed using the standard protocol following bolus administration of intravenous contrast. CONTRAST:  146mL  ISOVUE-300 IOPAMIDOL (ISOVUE-300) INJECTION 61% COMPARISON:  12/09/2008, 02/25/2005 FINDINGS: Lower chest: Ground-glass opacity at the periphery of the right lower lobe continuous with the diaphragm. No pleural effusion. Abnormal soft tissue at the distal esophagus near the GE junction in this patient with a given history of esophageal carcinoma. Hepatobiliary: Current CT demonstrates multiple bilateral liver masses. The largest are within the right liver, at the segment 5/6 junction measuring 5.1 cm x 6.7 cm. None of these were present on the comparison CT exam. Trace intrahepatic biliary dilatation of the left without radiopaque stones. Gallbladder relatively decompressed with no radiopaque stones. No extrahepatic biliary ductal dilatation or radiopaque stones. Pancreas: Unremarkable pancreas Spleen: Hypodense/hypoenhancing lesion at the periphery of the spleen measuring 1.1 cm which has more uniform attenuation to spleen on the delayed images. This lesion not present on the comparison CT. Adrenals/Urinary Tract: Unremarkable adrenal glands Rounded stone within the right renal pelvis measuring 12 mm. This was not present on the comparison CT. No evidence of hydronephrosis. Mild inflammatory change/edema in the fat at the right renal pelvis. Unremarkable course of the right ureter with no additional stones. Subcentimeter hypodense cyst at the lateral cortex of the right kidney without evidence of enhancement. No evidence of left-sided hydronephrosis. Bosniak 2 cyst at the inferior left kidney. Unremarkable course of the left ureter. Urinary bladder is decompressed with circumferential wall thickening. Stomach/Bowel: Abnormal circumferential thickening of the distal esophagus with small nodularity within the adjacent fat extending through the hiatus. Balloon retention gastrostomy terminates within the stomach. Small bowel unremarkable without abnormal distention or focal thickening. No inflammatory changes. Normal  appendix. Moderate stool burden without associated inflammation. No transition point or evidence of obstruction. Vascular/Lymphatic: Minimal atherosclerotic changes of the vasculature. No aneurysm. Small lymph nodes at the portacaval nodal station and at the liver hilum. No significant retroperitoneal adenopathy. Trace free fluid within the anatomic pelvis. Reproductive: Transverse diameter of the prostate estimated 19 mm Other: Small fat containing umbilical hernia Musculoskeletal: Degenerative changes of the visualized thoracolumbar spine. No significant bony canal narrowing. Multilevel degenerative disc disease degenerative changes of the bilateral hips. IMPRESSION: No CT finding to account for syncope. 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 Irregular soft tissue thickening at the distal esophagus and GE junction, compatible with carcinoma in this patient with given history of esophageal carcinoma. Small nodularity within the adjacent fat and evidence of likely pathologic lymph nodes at the liver hilum. Hypodense lesion at the periphery of the spleen, potentially additional metastatic disease versus benign lesion such as infarct. 12 mm stone within the right renal pelvis with mild associated inflammation and no hydronephrosis. If there is concern for infection/pyelonephritis, recommend correlation with urinalysis. Appropriately positioned balloon retention percutaneous gastrostomy tube. Aortic Atherosclerosis (ICD10-I70.0).  Trace free fluid in the anatomic pelvis, likely reactive. Electronically Signed   By: Corrie Mckusick D.O.   On: 04/23/2018 15:16    My interpretation of Electrocardiogram: Poor quality EKG shows sinus tachycardia at 102 bpm.  Normal axis.  PVCs noted.  No concerning ST or T wave changes.   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  Patient tells me that his blood pressure has been somewhat on the lower side for the past week or so and so he has not been taking his blood pressure medications.  However he is unable to tell me exactly how low it has been.  Syncope most likely due to a combination of orthostatic hypotension and vasovagal considering that he had nausea vomiting before he had the syncopal episode.  He is noted to be febrile.  Has low blood pressure.  There is concern for sepsis.  No clear source is identified.  Plan:  1. Syncope: Most likely secondary to orthostatic hypotension or vasovagal syndrome.  Patient will be monitored on telemetry.  He does not have any symptoms of chest pain or shortness of breath currently.  Low suspicion for venous thromboembolism at this time.  There is also concern for blood loss.  Echocardiogram.  CT head did not show any acute intracranial findings.  2.  Sepsis with likely septic shock: Patient with fever, leukocytosis, tachycardia, low blood pressure.  Patient's lactic acid level was noted to be elevated but not significantly so.  His blood pressure remains low.  He however is not very symptomatic at this time.  Additional fluid bolus and blood transfusion ordered by the ED provider.  Patient will be monitored in the stepdown unit for now.  No source of infection identified.  UA looks clear.  No infiltrates on chest x-ray.  CT scan of the abdomen does not show any obvious infectious source.  He does have a Port-A-Cath which was placed back in November.  No erythema noted around that site.  For now we will treat him with broad-spectrum antibiotics including vancomycin  cefepime and metronidazole.  Follow-up on blood cultures.  Repeat lactic acid levels.  Check procalcitonin.  3.  Hematemesis: He had one episode of bloody emesis.  He has received radiation treatment for his esophageal cancer.  Could have some radiation esophagitis.  Gastroenterology contacted by ED provider.  Patient will be placed on a PPI infusion for now.  Monitor CBCs closely.  4.  Normocytic anemia/acute blood loss anemia: 1 unit of blood ordered by the ED provider due to persistent hypotension.  Monitor CBC closely.  Transfuse as needed.  5.  Esophageal cancer with liver metastases/abnormal LFTs: Patient recently completed radiation treatment.  Plan was to start chemotherapy in the near future.  Followed by oncology at [facility].  Followed by radiation oncology here, Dr. Lisbeth Renshaw.  Patient has a PEG tube.  Will hold off on tube feedings for now.  6.  Hyponatremia: Most likely due to hypovolemia.  Recheck tomorrow morning.  7.  Thrombocytopenia: Etiology for this is unclear.  Could be due to  sepsis.  Could be due to liver metastases.  No previous blood reports available.  Recheck labs tomorrow.  DVT Prophylaxis: SCDs Code Status: Full code Family Communication: Discussed with the patient and his wife Consults called: Gastroenterology  Severity of Illness: The appropriate patient status for this patient is INPATIENT. Inpatient status is judged to be reasonable and necessary in order to provide the required intensity of service to ensure the patient's safety. The patient's presenting symptoms, physical exam findings, and initial radiographic and laboratory data in the context of their chronic comorbidities is felt to place them at high risk for further clinical deterioration. Furthermore, it is not anticipated that the patient will be medically stable for discharge from the hospital within 2 midnights of admission. The following factors support the patient status of inpatient.   " The  patient's presenting symptoms include syncope. " The worrisome physical exam findings include hypotension, fever. " The initial radiographic and laboratory data are worrisome because of concern for sepsis. " The chronic co-morbidities include esophageal cancer with metastases.   * I certify that at the point of admission it is my clinical judgment that the patient will require inpatient hospital care spanning beyond 2 midnights from the point of admission due to high intensity of service, high risk for further deterioration and high frequency of surveillance required.*    Further management decisions will depend on results of further testing and patient's response to treatment.   Bonnielee Haff  [facility] Pager [phone]  If 7PM-7AM, please contact night-coverage [email or web address] Password [facility]  04/19/2018, 4:42 PM

## 2018-04-07 NOTE — Progress Notes (Signed)
A consult was received from an ED physician for Vancomycin and Cefepime per pharmacy dosing (for an indication other than meningitis). The patient's profile has been reviewed for ht/wt/allergies/indication/available labs. A one time order has been placed for the above antibiotics.  Further antibiotics/pharmacy consults should be ordered by admitting physician if indicated.                       Reuel Boom, PharmD, BCPS [phone] 04/11/2018, 12:34 PM

## 2018-04-07 NOTE — Progress Notes (Signed)
CRITICAL VALUE ALERT  Critical Value: Hgb 5.5  Date & Time Notied: 04/24/2018  1750  Provider Notified: Dr.Krishnan  Orders Received/Actions taken: 1L NS bolus and increased PRBC to be transfused to 3 units total

## 2018-04-07 NOTE — Consult Note (Addendum)
Referring Provider:  Dr. Ronnald Nian, EDP Primary Care Physician:  [facility] Primary Gastroenterologist:  [facility]  Reason for Consultation:  Melena, heme positive stool, esophageal cancer  HPI: Drew Vega is a 63 y.o. male with stage IV/metastatic esophageal cancer diagnosed in November 2019 at the [facility].  Just finished radiation treatments here at [facility] facilities with his last treatment yesterday.  He recently had a food impaction and as a result had a feeding tube placed about 3 weeks ago.  Apparently his wife has copies of his endoscopy reports, but she was not in the room at the time of our interview.  He presented to [facility] ED today with syncopal episode at home and one episode of hematemesis.  He tells me that he completed his feeding regimen and was tolerating that fine.  Later he began to feel nauseated and rushed to the sink at which time he vomited out a small quantity of bright red blood.  He then had a syncopal episode.  He is not taking anything by mouth except for water but denies any issues with that including pain.  He denies having melena at home, but reports that he "does not really look or any attention".  Is anemic with hemoglobin of 8.2 g and no labs for comparison.  Repeat hemoglobin this evening was down to 5.5 g.  At the time of my interview the repeat hemoglobin had not been resulted, and he had not yet received any packed red blood cells.  EDP reported  melena on rectal exam in the ED that was heme positive.  Has been hypotensive and mildly tachycardic.  Pale appearing.   History reviewed. No pertinent past medical history.  Past Surgical History:  Procedure Laterality Date  . PROSTATECTOMY  2018    Prior to Admission medications   Medication Sig Start Date End Date Taking? Authorizing Provider  amLODipine (NORVASC) 10 MG tablet Take 10 mg by mouth daily.   Yes [provider]  aspirin EC 81 MG tablet Take 81 mg by mouth daily.   Yes [provider]  atenolol (TENORMIN) 100 MG tablet Take 100 mg by mouth daily.   Yes [provider]  chlorthalidone (HYGROTON) 25 MG tablet Take 12.5 mg by mouth daily. Take one half tablet daily.    Yes [provider]  lisinopril (PRINIVIL,ZESTRIL) 40 MG tablet Take 20 mg by mouth daily.    Yes [provider]  Omega-3 Fatty Acids (FISH OIL) 1000 MG CAPS Take 1,000 mg by mouth daily.   Yes [provider]  omeprazole (PRILOSEC) 20 MG capsule Take 20 mg by mouth daily.   Yes [provider]  sildenafil (VIAGRA) 100 MG tablet Take 100 mg by mouth daily as needed for erectile dysfunction.   Yes [provider]    Current Facility-Administered Medications  Medication Dose Route Frequency Provider Last Rate Last Dose  . iopamidol (ISOVUE-300) 61 % injection           . lactated ringers bolus 1,000 mL  1,000 mL Intravenous Once Lennice Sites, DO 983.6 mL/hr at 04/27/2018 1615 1,000 mL at 04/23/2018 1615  . pantoprazole (PROTONIX) injection 40 mg  40 mg Intravenous Once Curatolo, Adam, DO      . sodium chloride (PF) 0.9 % injection            Current Outpatient Medications  Medication Sig Dispense Refill  . amLODipine (NORVASC) 10 MG tablet Take 10 mg by mouth daily.    Marland Kitchen  aspirin EC 81 MG tablet Take 81 mg by mouth daily.    Marland Kitchen atenolol (TENORMIN) 100 MG tablet Take 100 mg by mouth daily.    . chlorthalidone (HYGROTON) 25 MG tablet Take 12.5 mg by mouth daily. Take one half tablet daily.     Marland Kitchen lisinopril (PRINIVIL,ZESTRIL) 40 MG tablet Take 20 mg by mouth daily.     . Omega-3 Fatty Acids (FISH OIL) 1000 MG CAPS Take 1,000 mg by mouth daily.    Marland Kitchen omeprazole (PRILOSEC) 20 MG capsule Take 20 mg by mouth daily.    . sildenafil (VIAGRA) 100 MG tablet Take 100 mg by mouth daily as needed for erectile dysfunction.      Allergies as of 04/11/2018  . (No Known Allergies)    Family History  Problem Relation Age of Onset  . Throat cancer Mother      Social History   Socioeconomic History  . Marital status: Single    Spouse name: Not on file  . Number of children: Not on file  . Years of education: Not on file  . Highest education level: Not on file  Occupational History  . Not on file  Social Needs  . Financial resource strain: Not on file  . Food insecurity:    Worry: Not on file    Inability: Not on file  . Transportation needs:    Medical: No    Non-medical: No  Tobacco Use  . Smoking status: Never Smoker  . Smokeless tobacco: Never Used  Substance and Sexual Activity  . Alcohol use: Yes    Comment: occasional  . Drug use: Not on file  . Sexual activity: Not on file  Lifestyle  . Physical activity:    Days per week: Not on file    Minutes per session: Not on file  . Stress: Not on file  Relationships  . Social connections:    Talks on phone: Not on file    Gets together: Not on file    Attends religious service: Not on file    Active member of club or organization: Not on file    Attends meetings of clubs or organizations: Not on file    Relationship status: Not on file  . Intimate partner violence:    Fear of current or ex partner: Not on file    Emotionally abused: Not on file    Physically abused: Not on file    Forced sexual activity: Not on file  Other Topics Concern  . Not on file  Social History Narrative  . Not on file    Review of Systems: ROS is O/W negative except as mentioned in HPI.  Physical Exam: Vital signs in last 24 hours: Temp:  [100.1 F (37.8 C)-101.5 F (38.6 C)] 101.5 F (38.6 C) (01/09 1202) Pulse Rate:  [99-105] 99 (01/09 1430) Resp:  [18-25] 21 (01/09 1430) BP: (88-98)/(56-76) 90/60 (01/09 1430) SpO2:  [98 %-100 %] 100 % (01/09 1430)   General:  Alert, Well-developed, well-nourished, pleasant and cooperative in NAD; pale.  Body covered in tattoos. Head:  Normocephalic and atraumatic. Eyes:  Sclera clear, no icterus.  Conjunctiva pale. Ears:  Normal auditory  acuity. Mouth:  No deformity or lesions.   Lungs:  Clear throughout to auscultation.  No wheezes, crackles, or rhonchi.  Heart:  Regular rate and rhythm; no murmurs, clicks, rubs, or gallops. Abdomen:  Soft, non-distended.  BS present.  Non-tender.  PEG site noted. Rectal:  Deferred.  Heme positive in  the ED.  Msk:  Symmetrical without gross deformities. Pulses:  Normal pulses noted. Extremities:  Without clubbing or edema. Neurologic:  Alert and oriented x 4;  grossly normal neurologically. Skin:  Intact without significant lesions or rashes.  Body covered in tattoos. Psych:  Alert and cooperative. Normal mood and affect.  Intake/Output this shift: Total I/O In: 2000 [I.V.:2000] Out: 80 [Urine:80]  Lab Results: Recent Labs    04/20/2018 1136  WBC 22.3*  HGB 8.2*  HCT 25.3*  PLT 98*   BMET Recent Labs    04/14/2018 1136  NA 128*  K 4.0  CL 95*  CO2 23  GLUCOSE 148*  BUN 24*  CREATININE 0.92  CALCIUM 7.6*   LFT Recent Labs    04/27/2018 1136  PROT 5.4*  ALBUMIN 2.2*  AST 56*  ALT 48*  ALKPHOS 575*  BILITOT 0.9   Studies/Results: Dg Chest 2 View  Result Date: 04/18/2018 CLINICAL DATA:  Syncope with fall.  Reported esophageal carcinoma EXAM: CHEST - 2 VIEW COMPARISON:  None. FINDINGS: Port-A-Cath tip is at the cavoatrial junction. No pneumothorax. No edema or consolidation. Heart size and pulmonary vascularity are normal. No adenopathy. No acute fracture evident. Multiple metallic pellets are noted on the left. IMPRESSION: No edema or consolidation.  No adenopathy.  No mediastinal widening. Metallic pellets on the left noted. Port-A-Cath tip at cavoatrial junction. No pneumothorax. Electronically Signed   By: Lowella Grip III M.D.   On: 04/15/2018 12:11   Ct Head Wo Contrast  Result Date: 04/17/2018 CLINICAL DATA:  Syncope with fall EXAM: CT HEAD WITHOUT CONTRAST TECHNIQUE: Contiguous axial images were obtained from the base of the skull through the vertex without  intravenous contrast. COMPARISON:  None. FINDINGS: Brain: There is slight diffuse atrophy. There is no intracranial mass, hemorrhage, extra-axial fluid collection, or midline shift. The brain parenchyma appears unremarkable. There is no demonstrable acute infarct. Vascular: There is no hyperdense vessel. There is no appreciable vascular calcification. Skull: The bony calvarium appears intact. Sinuses/Orbits: There is mucosal thickening in several ethmoid air cells. Other paranasal sinuses are clear. There is rightward deviation of the nasal septum. Orbits appear symmetric bilaterally. Other: The mastoid air cells are clear. IMPRESSION: Slight diffuse atrophy. Brain parenchyma appears unremarkable. No mass or hemorrhage. There is mild mucosal thickening in several ethmoid air cells. There is deviation of the nasal septum toward the right. Electronically Signed   By: Lowella Grip III M.D.   On: 04/26/2018 12:10   Ct Abdomen Pelvis W Contrast  Result Date: 04/09/2018 CLINICAL DATA:  63 year old male with a history of esophageal cancer. Syncope EXAM: CT ABDOMEN AND PELVIS WITH CONTRAST TECHNIQUE: Multidetector CT imaging of the abdomen and pelvis was performed using the standard protocol following bolus administration of intravenous contrast. CONTRAST:  113mL ISOVUE-300 IOPAMIDOL (ISOVUE-300) INJECTION 61% COMPARISON:  12/09/2008, 02/25/2005 FINDINGS: Lower chest: Ground-glass opacity at the periphery of the right lower lobe continuous with the diaphragm. No pleural effusion. Abnormal soft tissue at the distal esophagus near the GE junction in this patient with a given history of esophageal carcinoma. Hepatobiliary: Current CT demonstrates multiple bilateral liver masses. The largest are within the right liver, at the segment 5/6 junction measuring 5.1 cm x 6.7 cm. None of these were present on the comparison CT exam. Trace intrahepatic biliary dilatation of the left without radiopaque stones. Gallbladder  relatively decompressed with no radiopaque stones. No extrahepatic biliary ductal dilatation or radiopaque stones. Pancreas: Unremarkable pancreas Spleen: Hypodense/hypoenhancing lesion at the periphery  of the spleen measuring 1.1 cm which has more uniform attenuation to spleen on the delayed images. This lesion not present on the comparison CT. Adrenals/Urinary Tract: Unremarkable adrenal glands Rounded stone within the right renal pelvis measuring 12 mm. This was not present on the comparison CT. No evidence of hydronephrosis. Mild inflammatory change/edema in the fat at the right renal pelvis. Unremarkable course of the right ureter with no additional stones. Subcentimeter hypodense cyst at the lateral cortex of the right kidney without evidence of enhancement. No evidence of left-sided hydronephrosis. Bosniak 2 cyst at the inferior left kidney. Unremarkable course of the left ureter. Urinary bladder is decompressed with circumferential wall thickening. Stomach/Bowel: Abnormal circumferential thickening of the distal esophagus with small nodularity within the adjacent fat extending through the hiatus. Balloon retention gastrostomy terminates within the stomach. Small bowel unremarkable without abnormal distention or focal thickening. No inflammatory changes. Normal appendix. Moderate stool burden without associated inflammation. No transition point or evidence of obstruction. Vascular/Lymphatic: Minimal atherosclerotic changes of the vasculature. No aneurysm. Small lymph nodes at the portacaval nodal station and at the liver hilum. No significant retroperitoneal adenopathy. Trace free fluid within the anatomic pelvis. Reproductive: Transverse diameter of the prostate estimated 19 mm Other: Small fat containing umbilical hernia Musculoskeletal: Degenerative changes of the visualized thoracolumbar spine. No significant bony canal narrowing. Multilevel degenerative disc disease degenerative changes of the bilateral  hips. IMPRESSION: No CT finding to account for syncope. 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 Irregular soft tissue thickening at the distal esophagus and GE junction, compatible with carcinoma in this patient with given history of esophageal carcinoma. Small nodularity within the adjacent fat and evidence of likely pathologic lymph nodes at the liver hilum. Hypodense lesion at the periphery of the spleen, potentially additional metastatic disease versus benign lesion such as infarct. 12 mm stone within the right renal pelvis with mild associated inflammation and no hydronephrosis. If there is concern for infection/pyelonephritis, recommend correlation with urinalysis. Appropriately positioned balloon retention percutaneous gastrostomy tube. Aortic Atherosclerosis (ICD10-I70.0). Trace free fluid in the anatomic pelvis, likely reactive. Electronically Signed   By: Corrie Mckusick D.O.   On: 04/16/2018 15:16   IMPRESSION:  *63 year old male with stage IV esophageal cancer diagnosed in November 2019 at the [facility].  Just finished radiation treatments here at [facility] facilities with his last treatment yesterday.  Presenting with syncopal episode at home and one episode of hematemesis.  Is anemic but no labs for comparison.  Reports have melena on rectal exam in the ED that was heme positive.  Has been hypotensive and mildly tachycardic.  Bleeding likely from tumor itself versus friable mucosa that is been worsened by recent radiation treatments.  PLAN: -Needs resuscitation with blood transfusion and IV fluids.  They are awaiting his packed red blood cells currently. -We will see the patient again in the morning and will likely plan for endoscopy pending his status.  Would hold tube feeds for now. -Continue to monitor hemoglobin and transfuse further prn. -Will start pantoprazole 40 mg IV BID.  **Addendum: Repeat hemoglobin this evening was found to be 5.5  g.   Janett Billow D. Zehr  04/09/2018, 4:46 PM    [facility] Attending   I have taken an interval history, reviewed the chart and examined the patient. I agree with the Advanced Practitioner's note, impression and recommendations.   Most likely bleeding from esophageal cancer/XRT damage. Needs blood, IVF Anticipate EGD tomorrow assuming he is adequately  resuscitated with blood and crystalloids.  Gatha Mayer, MD, [facility] 04/04/2018 9:05 PM Pager [phone]

## 2018-04-07 NOTE — ED Notes (Signed)
ED TO INPATIENT HANDOFF REPORT  Name/Age/Gender Drew Vega 63 y.o. male  Code Status   Home/SNF/Other Home  Chief Complaint syncope  Level of Care/Admitting Diagnosis ED Disposition    ED Disposition Condition [facility] Area: [place] [[id number]]  Level of Care: Stepdown [14]  Admit to SDU based on following criteria: Hemodynamic compromise or significant risk of instability:  Patient requiring short term acute titration and management of vasoactive drips, and invasive monitoring (i.e., CVP and Arterial line).  Diagnosis: Septic shock [facility]) [[id number]]  Admitting Physician: Bonnielee Haff [[id number]]  Attending Physician: Bonnielee Haff [[id number]]  Estimated length of stay: past midnight tomorrow  Certification:: I certify this patient will need inpatient services for at least 2 midnights  PT Class (Do Not Modify): Inpatient [101]  PT Acc Code (Do Not Modify): Private [1]       Medical History History reviewed. No pertinent past medical history.  Allergies No Known Allergies  IV Location/Drains/Wounds Patient Lines/Drains/Airways Status   Active Line/Drains/Airways    Name:   Placement date:   Placement time:   Site:   Days:   Implanted Port 02/17/18 Right Chest   02/17/18    -    Chest   49   Peripheral IV 04/21/2018   04/05/2018    1115    -   less than 1          Labs/Imaging Results for orders placed or performed during the hospital encounter of 04/01/2018 (from the past 48 hour(s))  Comprehensive metabolic panel     Status: Abnormal   Collection Time: 03/30/2018 11:36 AM  Result Value Ref Range   Sodium 128 (L) 135 - 145 mmol/L   Potassium 4.0 3.5 - 5.1 mmol/L   Chloride 95 (L) 98 - 111 mmol/L   CO2 23 22 - 32 mmol/L   Glucose, Bld 148 (H) 70 - 99 mg/dL   BUN 24 (H) 8 - 23 mg/dL   Creatinine, Ser 0.92 0.61 - 1.24 mg/dL   Calcium 7.6 (L) 8.9 - 10.3 mg/dL   Total Protein 5.4 (L) 6.5 - 8.1 g/dL   Albumin 2.2 (L) 3.5 - 5.0 g/dL    AST 56 (H) 15 - 41 U/L   ALT 48 (H) 0 - 44 U/L   Alkaline Phosphatase 575 (H) 38 - 126 U/L   Total Bilirubin 0.9 0.3 - 1.2 mg/dL   GFR calc non Af Amer >60 >60 mL/min   GFR calc Af Amer >60 >60 mL/min   Anion gap 10 5 - 15    Comment: Performed at [facility], [place] [address], [place], [place] [address]  CBC WITH DIFFERENTIAL     Status: Abnormal   Collection Time: 03/30/2018 11:36 AM  Result Value Ref Range   WBC 22.3 (H) 4.0 - 10.5 K/uL   RBC 2.74 (L) 4.22 - 5.81 MIL/uL   Hemoglobin 8.2 (L) 13.0 - 17.0 g/dL   HCT 25.3 (L) 39.0 - 52.0 %   MCV 92.3 80.0 - 100.0 fL   MCH 29.9 26.0 - 34.0 pg   MCHC 32.4 30.0 - 36.0 g/dL   RDW 13.6 11.5 - 15.5 %   Platelets 98 (L) 150 - 400 K/uL    Comment: REPEATED TO VERIFY PLATELET COUNT CONFIRMED BY SMEAR SPECIMEN CHECKED FOR CLOTS Immature Platelet Fraction may be clinically indicated, consider ordering this additional test [id number]    nRBC 0.0 0.0 - 0.2 %   Neutrophils Relative %  80 %   Neutro Abs 18.0 (H) 1.7 - 7.7 K/uL   Lymphocytes Relative 4 %   Lymphs Abs 0.8 0.7 - 4.0 K/uL   Monocytes Relative 10 %   Monocytes Absolute 2.3 (H) 0.1 - 1.0 K/uL   Eosinophils Relative 1 %   Eosinophils Absolute 0.2 0.0 - 0.5 K/uL   Basophils Relative 0 %   Basophils Absolute 0.1 0.0 - 0.1 K/uL   Immature Granulocytes 5 %   Abs Immature Granulocytes 1.00 (H) 0.00 - 0.07 K/uL    Comment: Performed at [facility], [place] [address], [place], [place] [address]  Influenza panel by PCR (type A & B)     Status: None   Collection Time: 04/24/2018 11:37 AM  Result Value Ref Range   Influenza A By PCR NEGATIVE NEGATIVE   Influenza B By PCR NEGATIVE NEGATIVE    Comment: (NOTE) The Xpert Xpress Flu assay is intended as an aid in the diagnosis of  influenza and should not be used as a sole basis for treatment.  This  assay is FDA approved for nasopharyngeal swab specimens only. Nasal  washings and aspirates are unacceptable  for Xpert Xpress Flu testing. Performed at [facility], [place] [address], [place], [place] [address]   I-Stat CG4 Lactic Acid, ED     Status: Abnormal   Collection Time: 04/11/2018 11:44 AM  Result Value Ref Range   Lactic Acid, Venous 2.87 (HH) 0.5 - 1.9 mmol/L   Comment NOTIFIED PHYSICIAN   I-Stat CG4 Lactic Acid, ED     Status: Abnormal   Collection Time: 04/26/2018 12:52 PM  Result Value Ref Range   Lactic Acid, Venous 2.84 (HH) 0.5 - 1.9 mmol/L   Comment NOTIFIED PHYSICIAN   Urinalysis, Routine w reflex microscopic     Status: Abnormal   Collection Time: 04/09/2018  2:18 PM  Result Value Ref Range   Color, Urine AMBER (A) YELLOW    Comment: BIOCHEMICALS MAY BE AFFECTED BY COLOR   APPearance HAZY (A) CLEAR   Specific Gravity, Urine 1.021 1.005 - 1.030   pH 6.0 5.0 - 8.0   Glucose, UA 50 (A) NEGATIVE mg/dL   Hgb urine dipstick MODERATE (A) NEGATIVE   Bilirubin Urine NEGATIVE NEGATIVE   Ketones, ur NEGATIVE NEGATIVE mg/dL   Protein, ur 30 (A) NEGATIVE mg/dL   Nitrite NEGATIVE NEGATIVE   Leukocytes, UA NEGATIVE NEGATIVE   RBC / HPF 11-20 0 - 5 RBC/hpf   Bacteria, UA RARE (A) NONE SEEN   Squamous Epithelial / LPF 0-5 0 - 5   Mucus PRESENT    Hyaline Casts, UA PRESENT     Comment: Performed at [facility], [place] [address] [place], [place] [address]  POC occult blood, ED     Status: Abnormal   Collection Time: 04/23/2018  5:09 PM  Result Value Ref Range   Fecal Occult Bld POSITIVE (A) NEGATIVE   Dg Chest 2 View  Result Date: 04/18/2018 CLINICAL DATA:  Syncope with fall.  Reported esophageal carcinoma EXAM: CHEST - 2 VIEW COMPARISON:  None. FINDINGS: Port-A-Cath tip is at the cavoatrial junction. No pneumothorax. No edema or consolidation. Heart size and pulmonary vascularity are normal. No adenopathy. No acute fracture evident. Multiple metallic pellets are noted on the left. IMPRESSION: No edema or consolidation.  No adenopathy.  No mediastinal  widening. Metallic pellets on the left noted. Port-A-Cath tip at cavoatrial junction. No pneumothorax. Electronically Signed   By: Gwyndolyn Saxon  Jasmine December III M.D.   On: 04/13/2018 12:11   Ct Head Wo Contrast  Result Date: 04/09/2018 CLINICAL DATA:  Syncope with fall EXAM: CT HEAD WITHOUT CONTRAST TECHNIQUE: Contiguous axial images were obtained from the base of the skull through the vertex without intravenous contrast. COMPARISON:  None. FINDINGS: Brain: There is slight diffuse atrophy. There is no intracranial mass, hemorrhage, extra-axial fluid collection, or midline shift. The brain parenchyma appears unremarkable. There is no demonstrable acute infarct. Vascular: There is no hyperdense vessel. There is no appreciable vascular calcification. Skull: The bony calvarium appears intact. Sinuses/Orbits: There is mucosal thickening in several ethmoid air cells. Other paranasal sinuses are clear. There is rightward deviation of the nasal septum. Orbits appear symmetric bilaterally. Other: The mastoid air cells are clear. IMPRESSION: Slight diffuse atrophy. Brain parenchyma appears unremarkable. No mass or hemorrhage. There is mild mucosal thickening in several ethmoid air cells. There is deviation of the nasal septum toward the right. Electronically Signed   By: Lowella Grip III M.D.   On: 04/17/2018 12:10   Ct Abdomen Pelvis W Contrast  Result Date: 04/20/2018 CLINICAL DATA:  63 year old male with a history of esophageal cancer. Syncope EXAM: CT ABDOMEN AND PELVIS WITH CONTRAST TECHNIQUE: Multidetector CT imaging of the abdomen and pelvis was performed using the standard protocol following bolus administration of intravenous contrast. CONTRAST:  122mL ISOVUE-300 IOPAMIDOL (ISOVUE-300) INJECTION 61% COMPARISON:  12/09/2008, 02/25/2005 FINDINGS: Lower chest: Ground-glass opacity at the periphery of the right lower lobe continuous with the diaphragm. No pleural effusion. Abnormal soft tissue at the distal esophagus  near the GE junction in this patient with a given history of esophageal carcinoma. Hepatobiliary: Current CT demonstrates multiple bilateral liver masses. The largest are within the right liver, at the segment 5/6 junction measuring 5.1 cm x 6.7 cm. None of these were present on the comparison CT exam. Trace intrahepatic biliary dilatation of the left without radiopaque stones. Gallbladder relatively decompressed with no radiopaque stones. No extrahepatic biliary ductal dilatation or radiopaque stones. Pancreas: Unremarkable pancreas Spleen: Hypodense/hypoenhancing lesion at the periphery of the spleen measuring 1.1 cm which has more uniform attenuation to spleen on the delayed images. This lesion not present on the comparison CT. Adrenals/Urinary Tract: Unremarkable adrenal glands Rounded stone within the right renal pelvis measuring 12 mm. This was not present on the comparison CT. No evidence of hydronephrosis. Mild inflammatory change/edema in the fat at the right renal pelvis. Unremarkable course of the right ureter with no additional stones. Subcentimeter hypodense cyst at the lateral cortex of the right kidney without evidence of enhancement. No evidence of left-sided hydronephrosis. Bosniak 2 cyst at the inferior left kidney. Unremarkable course of the left ureter. Urinary bladder is decompressed with circumferential wall thickening. Stomach/Bowel: Abnormal circumferential thickening of the distal esophagus with small nodularity within the adjacent fat extending through the hiatus. Balloon retention gastrostomy terminates within the stomach. Small bowel unremarkable without abnormal distention or focal thickening. No inflammatory changes. Normal appendix. Moderate stool burden without associated inflammation. No transition point or evidence of obstruction. Vascular/Lymphatic: Minimal atherosclerotic changes of the vasculature. No aneurysm. Small lymph nodes at the portacaval nodal station and at the liver  hilum. No significant retroperitoneal adenopathy. Trace free fluid within the anatomic pelvis. Reproductive: Transverse diameter of the prostate estimated 19 mm Other: Small fat containing umbilical hernia Musculoskeletal: Degenerative changes of the visualized thoracolumbar spine. No significant bony canal narrowing. Multilevel degenerative disc disease degenerative changes of the bilateral hips. IMPRESSION: No CT finding to account  for syncope. 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 Irregular soft tissue thickening at the distal esophagus and GE junction, compatible with carcinoma in this patient with given history of esophageal carcinoma. Small nodularity within the adjacent fat and evidence of likely pathologic lymph nodes at the liver hilum. Hypodense lesion at the periphery of the spleen, potentially additional metastatic disease versus benign lesion such as infarct. 12 mm stone within the right renal pelvis with mild associated inflammation and no hydronephrosis. If there is concern for infection/pyelonephritis, recommend correlation with urinalysis. Appropriately positioned balloon retention percutaneous gastrostomy tube. Aortic Atherosclerosis (ICD10-I70.0). Trace free fluid in the anatomic pelvis, likely reactive. Electronically Signed   By: Corrie Mckusick D.O.   On: 04/04/2018 15:16   EKG Interpretation  Date/Time:  Thursday April 07 2018 11:18:55 EST Ventricular Rate:  102 PR Interval:    QRS Duration: 84 QT Interval:  347 QTC Calculation: 452 R Axis:   21 Text Interpretation:  Sinus tachycardia Ventricular premature complex Low voltage, extremity leads Confirmed by Lennice Sites [phone]) on 04/05/2018 11:29:41 AM   Pending Labs Unresulted Labs (From admission, onward)    Start     Ordered   04/19/2018 1613  Prepare RBC  (Adult Blood Administration - Red Blood Cells)  Once,   R    Question Answer Comment  # of Units 1 unit    Transfusion Indications Emergent / Trauma   If emergent release call blood bank [facility] [phone]      04/19/2018 1613   04/19/2018 1602  CBC with Differential  Once,   STAT     03/31/2018 1601   04/19/2018 1601  Type and screen [place]  Once,   STAT    Comments:  [place]    04/28/2018 1600   04/06/2018 1110  Blood Culture (routine x 2)  BLOOD CULTURE X 2,   STAT     04/01/2018 1112   04/15/2018 1110  Urine culture  ONCE - STAT,   STAT     04/11/2018 1112   Signed and Held  HIV antibody (Routine Testing)  Tomorrow morning,   R     Signed and Held   Signed and Held  Lactic acid, plasma  STAT Now then every 3 hours,   STAT     Signed and Held   Signed and Held  Procalcitonin  ONCE - STAT,   R     Signed and Held   Signed and Held  Protime-INR  ONCE - STAT,   R     Signed and Held   Signed and Held  APTT  ONCE - STAT,   R     Signed and Held   Signed and Held  Comprehensive metabolic panel  Tomorrow morning,   R     Signed and Held   Signed and Held  CBC  Now then every 6 hours,   R     Signed and Held          Vitals/Pain Today's Vitals   04/23/2018 1430 04/04/2018 1615 04/17/2018 1700 04/20/2018 1703  BP: 90/60 (!) 86/52 (!) 78/60 (!) 89/62  Pulse: 99 100 93 94  Resp: (!) 21  16   Temp:      TempSrc:      SpO2: 100% 100% 100% 100%    Isolation Precautions Droplet precaution  Medications Medications  iopamidol (ISOVUE-300) 61 % injection (has no administration in time range)  sodium chloride (PF) 0.9 % injection (has no administration in time range)  pantoprazole (PROTONIX) injection 40 mg (has no administration in time range)  lactated ringers bolus 1,000 mL (0 mLs Intravenous Stopped 04/23/2018 1308)  acetaminophen (TYLENOL) solution 650 mg (650 mg Per Tube Given 04/27/2018 1312)  vancomycin (VANCOCIN) IVPB 1000 mg/200 mL premix (0 mg Intravenous Stopped 04/11/2018 1410)  ceFEPIme (MAXIPIME) 2 g in sodium chloride 0.9 % 100 mL IVPB (0 g  Intravenous Stopped 03/30/2018 1410)  lactated ringers bolus 1,000 mL (0 mLs Intravenous Stopped 04/11/2018 1458)  ondansetron (ZOFRAN) injection 4 mg (4 mg Intravenous Given 04/22/2018 1307)  iopamidol (ISOVUE-300) 61 % injection 100 mL (100 mLs Intravenous Contrast Given 04/28/2018 1440)  lactated ringers bolus 1,000 mL (1,000 mLs Intravenous Transfusing/Transfer 04/11/2018 1718)    Mobility walks

## 2018-04-07 NOTE — ED Notes (Signed)
As I write this, he is in CT.

## 2018-04-07 NOTE — ED Triage Notes (Signed)
He tells me he has esophageal cancer. He also states he  Had a P.E.G. tube placed about a week ago. He tells me he "passed out" upon standing quickly to vomit. He arrives here in no distress. He has an abrasion with some swelling at left occipitoparietal area.

## 2018-04-07 NOTE — ED Provider Notes (Addendum)
[place] DEPT Provider Note   CSN: [id number] Arrival date & time: 04/29/2018  1040     History   Chief Complaint Chief Complaint  Patient presents with  . Loss of Consciousness    HPI Drew Vega is a 63 y.o. male.  The history is provided by the patient.  Loss of Consciousness  Episode history:  Single Most recent episode:  Today Progression:  Resolved Chronicity:  New Context: dehydration (possible)   Context comment:  Patient with history of esophageal cancer currently undergoing radiation therapy with last radiation yesterday.  Got lightheaded and passed out while standing this morning by the sink.  Currently started on PEG tube last week. Witnessed: yes   Relieved by:  Nothing Associated symptoms: nausea, recent surgery, vomiting (blood intermixed in sputum) and weakness   Associated symptoms: no chest pain, no dizziness, no fever, no palpitations, no seizures and no shortness of breath     History reviewed. No pertinent past medical history.  Patient Active Problem List   Diagnosis Date Noted  . Primary adenocarcinoma of distal third of esophagus ([facility]) 03/08/2018    Past Surgical History:  Procedure Laterality Date  . PROSTATECTOMY  2018        Home Medications    Prior to Admission medications   Medication Sig Start Date End Date Taking? Authorizing Provider  amLODipine (NORVASC) 10 MG tablet Take 10 mg by mouth daily.   Yes [provider]  aspirin EC 81 MG tablet Take 81 mg by mouth daily.   Yes [provider]  atenolol (TENORMIN) 100 MG tablet Take 100 mg by mouth daily.   Yes [provider]  chlorthalidone (HYGROTON) 25 MG tablet Take 12.5 mg by mouth daily. Take one half tablet daily.    Yes [provider]  lisinopril (PRINIVIL,ZESTRIL) 40 MG tablet Take 20 mg by mouth daily.    Yes [provider]  Omega-3 Fatty Acids (FISH OIL) 1000 MG CAPS Take 1,000 mg by mouth  daily.   Yes [provider]  omeprazole (PRILOSEC) 20 MG capsule Take 20 mg by mouth daily.   Yes [provider]  sildenafil (VIAGRA) 100 MG tablet Take 100 mg by mouth daily as needed for erectile dysfunction.   Yes [provider]    Family History Family History  Problem Relation Age of Onset  . Throat cancer Mother     Social History Social History   Tobacco Use  . Smoking status: Never Smoker  . Smokeless tobacco: Never Used  Substance Use Topics  . Alcohol use: Yes    Comment: occasional  . Drug use: Not on file     Allergies   Patient has no known allergies.   Review of Systems Review of Systems  Constitutional: Positive for fatigue. Negative for chills and fever.  HENT: Negative for congestion, drooling, ear pain and sore throat.   Eyes: Negative for pain and visual disturbance.  Respiratory: Negative for cough and shortness of breath.   Cardiovascular: Positive for syncope. Negative for chest pain and palpitations.  Gastrointestinal: Positive for constipation, nausea and vomiting (blood intermixed in sputum). Negative for abdominal distention, abdominal pain, anal bleeding, blood in stool and rectal pain.  Genitourinary: Negative for dysuria and hematuria.  Musculoskeletal: Negative for arthralgias and back pain.  Skin: Negative for color change and rash.  Neurological: Positive for weakness and light-headedness. Negative for dizziness, seizures and syncope.  All other systems reviewed and are negative.  Physical Exam Updated Vital Signs  ED Triage Vitals  Enc Vitals Group     BP 04/11/2018 1100 98/74     Pulse Rate 04/10/2018 1100 (!) 101     Resp 04/09/2018 1103 18     Temp 04/13/2018 1103 100.1 F (37.8 C)     Temp Source 04/19/2018 1103 Oral     SpO2 04/20/2018 1100 100 %     Weight --      Height --      Head Circumference --      Peak Flow --      Pain Score --      Pain Loc --      Pain Edu? --      Excl. in [place]? --      Physical Exam Vitals signs and nursing note reviewed.  Constitutional:      Appearance: He is well-developed. He is ill-appearing (pale).  HENT:     Head: Normocephalic and atraumatic.     Nose: Nose normal.     Mouth/Throat:     Mouth: Mucous membranes are dry.  Eyes:     Extraocular Movements: Extraocular movements intact.     Conjunctiva/sclera: Conjunctivae normal.     Pupils: Pupils are equal, round, and reactive to light.  Neck:     Musculoskeletal: Normal range of motion and neck supple.  Cardiovascular:     Rate and Rhythm: Regular rhythm. Tachycardia present.     Heart sounds: Normal heart sounds. No murmur.  Pulmonary:     Effort: Pulmonary effort is normal. No respiratory distress.     Breath sounds: Normal breath sounds.  Abdominal:     General: There is no distension.     Palpations: Abdomen is soft.     Tenderness: There is no abdominal tenderness.     Comments: PEG site well-appearing  Genitourinary:    Rectum: Guaiac result positive (gross melena on exam).  Musculoskeletal: Normal range of motion.        General: No swelling.  Skin:    General: Skin is warm and dry.     Capillary Refill: Capillary refill takes less than 2 seconds.     Comments: Right sided chest port well-appearing  Neurological:     General: No focal deficit present.     Mental Status: He is alert and oriented to person, place, and time.     Cranial Nerves: No cranial nerve deficit.     Sensory: No sensory deficit.     Motor: No weakness.     Coordination: Coordination normal.     Comments: 5+/5 strength, normal sensation, no drift, normal finger to nose finger  Psychiatric:        Mood and Affect: Mood normal.      ED Treatments / Results  Labs (all labs ordered are listed, but only abnormal results are displayed) Labs Reviewed  COMPREHENSIVE METABOLIC PANEL - Abnormal; Notable for the following components:      Result Value   Sodium 128 (*)    Chloride 95 (*)     Glucose, Bld 148 (*)    BUN 24 (*)    Calcium 7.6 (*)    Total Protein 5.4 (*)    Albumin 2.2 (*)    AST 56 (*)    ALT 48 (*)    Alkaline Phosphatase 575 (*)    All other components within normal limits  CBC WITH DIFFERENTIAL/PLATELET - Abnormal; Notable for the following components:   WBC 22.3 (*)  RBC 2.74 (*)    Hemoglobin 8.2 (*)    HCT 25.3 (*)    Platelets 98 (*)    Neutro Abs 18.0 (*)    Monocytes Absolute 2.3 (*)    Abs Immature Granulocytes 1.00 (*)    All other components within normal limits  URINALYSIS, ROUTINE W REFLEX MICROSCOPIC - Abnormal; Notable for the following components:   Color, Urine AMBER (*)    APPearance HAZY (*)    Glucose, UA 50 (*)    Hgb urine dipstick MODERATE (*)    Protein, ur 30 (*)    Bacteria, UA RARE (*)    All other components within normal limits  I-STAT CG4 LACTIC ACID, ED - Abnormal; Notable for the following components:   Lactic Acid, Venous 2.87 (*)    All other components within normal limits  I-STAT CG4 LACTIC ACID, ED - Abnormal; Notable for the following components:   Lactic Acid, Venous 2.84 (*)    All other components within normal limits  CULTURE, BLOOD (ROUTINE X 2)  CULTURE, BLOOD (ROUTINE X 2)  URINE CULTURE  INFLUENZA PANEL BY PCR (TYPE A & B)  CBC WITH DIFFERENTIAL/PLATELET  POC OCCULT BLOOD, ED  TYPE AND SCREEN  PREPARE RBC (CROSSMATCH)    EKG EKG Interpretation  Date/Time:  Thursday April 07 2018 11:18:55 EST Ventricular Rate:  102 PR Interval:    QRS Duration: 84 QT Interval:  347 QTC Calculation: 452 R Axis:   21 Text Interpretation:  Sinus tachycardia Ventricular premature complex Low voltage, extremity leads Confirmed by Lennice Sites [phone]) on 04/08/2018 11:29:41 AM   Radiology Dg Chest 2 View  Result Date: 04/11/2018 CLINICAL DATA:  Syncope with fall.  Reported esophageal carcinoma EXAM: CHEST - 2 VIEW COMPARISON:  None. FINDINGS: Port-A-Cath tip is at the cavoatrial junction. No  pneumothorax. No edema or consolidation. Heart size and pulmonary vascularity are normal. No adenopathy. No acute fracture evident. Multiple metallic pellets are noted on the left. IMPRESSION: No edema or consolidation.  No adenopathy.  No mediastinal widening. Metallic pellets on the left noted. Port-A-Cath tip at cavoatrial junction. No pneumothorax. Electronically Signed   By: Lowella Grip III M.D.   On: 04/06/2018 12:11   Ct Head Wo Contrast  Result Date: 04/13/2018 CLINICAL DATA:  Syncope with fall EXAM: CT HEAD WITHOUT CONTRAST TECHNIQUE: Contiguous axial images were obtained from the base of the skull through the vertex without intravenous contrast. COMPARISON:  None. FINDINGS: Brain: There is slight diffuse atrophy. There is no intracranial mass, hemorrhage, extra-axial fluid collection, or midline shift. The brain parenchyma appears unremarkable. There is no demonstrable acute infarct. Vascular: There is no hyperdense vessel. There is no appreciable vascular calcification. Skull: The bony calvarium appears intact. Sinuses/Orbits: There is mucosal thickening in several ethmoid air cells. Other paranasal sinuses are clear. There is rightward deviation of the nasal septum. Orbits appear symmetric bilaterally. Other: The mastoid air cells are clear. IMPRESSION: Slight diffuse atrophy. Brain parenchyma appears unremarkable. No mass or hemorrhage. There is mild mucosal thickening in several ethmoid air cells. There is deviation of the nasal septum toward the right. Electronically Signed   By: Lowella Grip III M.D.   On: 04/17/2018 12:10   Ct Abdomen Pelvis W Contrast  Result Date: 04/26/2018 CLINICAL DATA:  63 year old male with a history of esophageal cancer. Syncope EXAM: CT ABDOMEN AND PELVIS WITH CONTRAST TECHNIQUE: Multidetector CT imaging of the abdomen and pelvis was performed using the standard protocol following bolus administration of intravenous  contrast. CONTRAST:  141mL ISOVUE-300  IOPAMIDOL (ISOVUE-300) INJECTION 61% COMPARISON:  12/09/2008, 02/25/2005 FINDINGS: Lower chest: Ground-glass opacity at the periphery of the right lower lobe continuous with the diaphragm. No pleural effusion. Abnormal soft tissue at the distal esophagus near the GE junction in this patient with a given history of esophageal carcinoma. Hepatobiliary: Current CT demonstrates multiple bilateral liver masses. The largest are within the right liver, at the segment 5/6 junction measuring 5.1 cm x 6.7 cm. None of these were present on the comparison CT exam. Trace intrahepatic biliary dilatation of the left without radiopaque stones. Gallbladder relatively decompressed with no radiopaque stones. No extrahepatic biliary ductal dilatation or radiopaque stones. Pancreas: Unremarkable pancreas Spleen: Hypodense/hypoenhancing lesion at the periphery of the spleen measuring 1.1 cm which has more uniform attenuation to spleen on the delayed images. This lesion not present on the comparison CT. Adrenals/Urinary Tract: Unremarkable adrenal glands Rounded stone within the right renal pelvis measuring 12 mm. This was not present on the comparison CT. No evidence of hydronephrosis. Mild inflammatory change/edema in the fat at the right renal pelvis. Unremarkable course of the right ureter with no additional stones. Subcentimeter hypodense cyst at the lateral cortex of the right kidney without evidence of enhancement. No evidence of left-sided hydronephrosis. Bosniak 2 cyst at the inferior left kidney. Unremarkable course of the left ureter. Urinary bladder is decompressed with circumferential wall thickening. Stomach/Bowel: Abnormal circumferential thickening of the distal esophagus with small nodularity within the adjacent fat extending through the hiatus. Balloon retention gastrostomy terminates within the stomach. Small bowel unremarkable without abnormal distention or focal thickening. No inflammatory changes. Normal appendix.  Moderate stool burden without associated inflammation. No transition point or evidence of obstruction. Vascular/Lymphatic: Minimal atherosclerotic changes of the vasculature. No aneurysm. Small lymph nodes at the portacaval nodal station and at the liver hilum. No significant retroperitoneal adenopathy. Trace free fluid within the anatomic pelvis. Reproductive: Transverse diameter of the prostate estimated 19 mm Other: Small fat containing umbilical hernia Musculoskeletal: Degenerative changes of the visualized thoracolumbar spine. No significant bony canal narrowing. Multilevel degenerative disc disease degenerative changes of the bilateral hips. IMPRESSION: No CT finding to account for syncope. 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 Irregular soft tissue thickening at the distal esophagus and GE junction, compatible with carcinoma in this patient with given history of esophageal carcinoma. Small nodularity within the adjacent fat and evidence of likely pathologic lymph nodes at the liver hilum. Hypodense lesion at the periphery of the spleen, potentially additional metastatic disease versus benign lesion such as infarct. 12 mm stone within the right renal pelvis with mild associated inflammation and no hydronephrosis. If there is concern for infection/pyelonephritis, recommend correlation with urinalysis. Appropriately positioned balloon retention percutaneous gastrostomy tube. Aortic Atherosclerosis (ICD10-I70.0). Trace free fluid in the anatomic pelvis, likely reactive. Electronically Signed   By: Corrie Mckusick D.O.   On: 04/06/2018 15:16    Procedures .Critical Care Performed by: Lennice Sites, DO Authorized by: Lennice Sites, DO   Critical care provider statement:    Critical care time (minutes):  65   Critical care was necessary to treat or prevent imminent or life-threatening deterioration of the following conditions:  Sepsis and shock (GI  bleed)   Critical care was time spent personally by me on the following activities:  Blood draw for specimens, development of treatment plan with patient or surrogate, discussions with primary provider, evaluation of patient's response to treatment, examination of patient, obtaining history  from patient or surrogate, ordering and performing treatments and interventions, ordering and review of laboratory studies, ordering and review of radiographic studies, pulse oximetry, re-evaluation of patient's condition and review of old charts   I assumed direction of critical care for this patient from another provider in my specialty: no     (including critical care time)  Medications Ordered in ED Medications  iopamidol (ISOVUE-300) 61 % injection (has no administration in time range)  sodium chloride (PF) 0.9 % injection (has no administration in time range)  pantoprazole (PROTONIX) injection 40 mg (has no administration in time range)  lactated ringers bolus 1,000 mL (1,000 mLs Intravenous New Bag/Given 04/01/2018 1615)  lactated ringers bolus 1,000 mL (0 mLs Intravenous Stopped 03/31/2018 1308)  acetaminophen (TYLENOL) solution 650 mg (650 mg Per Tube Given 04/15/2018 1312)  vancomycin (VANCOCIN) IVPB 1000 mg/200 mL premix (0 mg Intravenous Stopped 04/10/2018 1410)  ceFEPIme (MAXIPIME) 2 g in sodium chloride 0.9 % 100 mL IVPB (0 g Intravenous Stopped 04/13/2018 1410)  lactated ringers bolus 1,000 mL (0 mLs Intravenous Stopped 04/24/2018 1458)  ondansetron (ZOFRAN) injection 4 mg (4 mg Intravenous Given 04/11/2018 1307)  iopamidol (ISOVUE-300) 61 % injection 100 mL (100 mLs Intravenous Contrast Given 04/09/2018 1440)     Initial Impression / Assessment and Plan / ED Course  I have reviewed the triage vital signs and the nursing notes.  Pertinent labs & imaging results that were available during my care of the patient were reviewed by me and considered in my medical decision making (see chart for details).     Drew Vega  is a 63 year old male with history of esophageal cancer currently undergoing radiation therapy who presents to the ED with syncopal event, weakness.  Patient with fever, soft blood pressures upon arrival.  Mildly tachycardic.  Concern for sepsis.  Patient recently had PEG tube placed last week due to difficulty with swallowing secondary to esophageal cancer.  Had radiation therapy yesterday.  Patient follows mostly at the [place].  He got up to go walk around his house today when he got lightheaded dizzy and fell.  He got IV fluids with EMS at around 500 cc and he has felt better.  Currently he is being fed every 2 hours through his PEG tube with free water flushes.  He denies any melena.  He did say he felt nauseous today and threw up.  There was some blood mixed in with his vomit.  Patient looks pale, overall chronically ill.  Does have some mild abdominal tenderness on exam.  PEG site, port site overall well-appearing.  Patient denies any back pain.  Denies any cough, sputum production.  Given fever, tachycardia, soft blood pressure patient had infectious work-up with blood cultures, chest x-ray, CT abdomen and pelvis, urine studies, lactic acid.  Patient empirically started on IV cefepime and IV vancomycin.  No obvious pneumonia found on chest x-ray.  Patient with CT head performed due to head trauma that showed no acute findings.  Patient does have mild hematoma over the posterior scalp.  Patient with no signs of urinary tract infection.  Patient with a lactic acid of 2.8.  Patient was given 30 cc/kg of fluid when EMS fluid is accounted for.  Blood pressure has improved.  Tachycardia has improved.  Patient with mild hyponatremia at 128 but otherwise no significant electrolyte abnormality.  Liver enzymes mildly elevated.  However bilirubin normal.  Influenza testing was negative.  CT of the abdomen and pelvis showed no infectious sources.  Some inflammation around the kidneys however no signs of urinary tract  infection.  Known liver mets.  Overall unremarkable CT scan abdomen and pelvis.  Rectal exam did show gross melena.  Patient possibly with slow GI bleed as well.    Patient had improved hemodynamics after 30 cc/kg of fluids however did become hypotensive.  At that point emergency release blood was ordered as suspect that hypotension is possibly from GI source and not infectious source. Pt received one unit of PRBCs. Patient to be admitted to hospitalist.  He got an additional fluid bolus and hemodynamics improved.  GI was contacted, Dr. Myrtice Lauth, to make them aware that patient likely has GI bleed.  This chart was dictated using voice recognition software.  Despite best efforts to proofread,  errors can occur which can change the documentation meaning.   Final Clinical Impressions(s) / ED Diagnoses   Final diagnoses:  Syncope and collapse  Fever, unspecified fever cause  Sepsis, due to unspecified organism, unspecified whether acute organ dysfunction present [facility])  Gastrointestinal hemorrhage, unspecified gastrointestinal hemorrhage type    ED Discharge Orders    None       Lennice Sites, DO 04/29/2018 [place], [place], DO 04/11/2018 1636

## 2018-04-07 NOTE — ED Notes (Signed)
Bed: QV79 Expected date:  Expected time:  Means of arrival:  Comments: EMS 62yo m syncope, hx of esophageal ca, orthostatic pos

## 2018-04-07 NOTE — ED Notes (Signed)
Pt unable to provide urine specimen at this time. Urinal at the bedside.

## 2018-04-07 NOTE — Progress Notes (Signed)
His wife reports he has not had any of his home medications since being discharged from the [place]. He is unable to tolerate swallowing them.   Romeo Rabon, PharmD. Mobile: [phone]. 04/06/2018,1:20 PM.

## 2018-04-08 ENCOUNTER — Encounter (HOSPITAL_COMMUNITY): Payer: Self-pay

## 2018-04-08 ENCOUNTER — Inpatient Hospital Stay (HOSPITAL_COMMUNITY): Payer: No Typology Code available for payment source

## 2018-04-08 ENCOUNTER — Other Ambulatory Visit: Payer: Self-pay

## 2018-04-08 ENCOUNTER — Encounter (HOSPITAL_COMMUNITY): Admission: EM | Disposition: E | Payer: Self-pay | Source: Home / Self Care | Attending: Internal Medicine

## 2018-04-08 ENCOUNTER — Inpatient Hospital Stay (HOSPITAL_COMMUNITY): Payer: No Typology Code available for payment source | Admitting: Anesthesiology

## 2018-04-08 DIAGNOSIS — E871 Hypo-osmolality and hyponatremia: Secondary | ICD-10-CM

## 2018-04-08 DIAGNOSIS — R55 Syncope and collapse: Secondary | ICD-10-CM

## 2018-04-08 DIAGNOSIS — K922 Gastrointestinal hemorrhage, unspecified: Secondary | ICD-10-CM

## 2018-04-08 HISTORY — PX: ESOPHAGOGASTRODUODENOSCOPY: SHX5428

## 2018-04-08 LAB — CBC
HCT: 24.3 % — ABNORMAL LOW (ref 39.0–52.0)
HCT: 24.5 % — ABNORMAL LOW (ref 39.0–52.0)
HEMOGLOBIN: 8.2 g/dL — AB (ref 13.0–17.0)
Hemoglobin: 8.1 g/dL — ABNORMAL LOW (ref 13.0–17.0)
MCH: 30.4 pg (ref 26.0–34.0)
MCH: 30 pg (ref 26.0–34.0)
MCHC: 33.7 g/dL (ref 30.0–36.0)
MCHC: 33.1 g/dL (ref 30.0–36.0)
MCV: 90 fL (ref 80.0–100.0)
MCV: 90.7 fL (ref 80.0–100.0)
Platelets: 96 10*3/uL — ABNORMAL LOW (ref 150–400)
Platelets: 92 10*3/uL — ABNORMAL LOW (ref 150–400)
RBC: 2.7 MIL/uL — ABNORMAL LOW (ref 4.22–5.81)
RBC: 2.7 MIL/uL — ABNORMAL LOW (ref 4.22–5.81)
RDW: 13.9 % (ref 11.5–15.5)
RDW: 14.2 % (ref 11.5–15.5)
WBC: 19.3 10*3/uL — ABNORMAL HIGH (ref 4.0–10.5)
WBC: 20.3 10*3/uL — ABNORMAL HIGH (ref 4.0–10.5)
nRBC: 0 % (ref 0.0–0.2)
nRBC: 0 % (ref 0.0–0.2)

## 2018-04-08 LAB — ECHOCARDIOGRAM COMPLETE
Height: 67 in
Weight: 2532.64 oz

## 2018-04-08 LAB — COMPREHENSIVE METABOLIC PANEL
ALBUMIN: 1.9 g/dL — AB (ref 3.5–5.0)
ALT: 49 U/L — ABNORMAL HIGH (ref 0–44)
AST: 97 U/L — ABNORMAL HIGH (ref 15–41)
Alkaline Phosphatase: 347 U/L — ABNORMAL HIGH (ref 38–126)
Anion gap: 8 (ref 5–15)
BUN: 35 mg/dL — ABNORMAL HIGH (ref 8–23)
CALCIUM: 7.3 mg/dL — AB (ref 8.9–10.3)
CO2: 21 mmol/L — ABNORMAL LOW (ref 22–32)
Chloride: 103 mmol/L (ref 98–111)
Creatinine, Ser: 0.82 mg/dL (ref 0.61–1.24)
GFR calc Af Amer: >60 mL/min (ref >60)
GFR calc non Af Amer: >60 mL/min (ref >60)
Glucose, Bld: 104 mg/dL — ABNORMAL HIGH (ref 70–99)
POTASSIUM: 4.3 mmol/L (ref 3.5–5.1)
Sodium: 132 mmol/L — ABNORMAL LOW (ref 135–145)
TOTAL PROTEIN: 4.5 g/dL — AB (ref 6.5–8.1)
Total Bilirubin: 1.4 mg/dL — ABNORMAL HIGH (ref 0.3–1.2)

## 2018-04-08 LAB — URINE CULTURE: Culture: NO GROWTH

## 2018-04-08 LAB — SODIUM: Sodium: 132 mmol/L — ABNORMAL LOW (ref 135–145)

## 2018-04-08 LAB — HIV ANTIBODY (ROUTINE TESTING W REFLEX): HIV Screen 4th Generation wRfx: NONREACTIVE

## 2018-04-08 SURGERY — EGD (ESOPHAGOGASTRODUODENOSCOPY)
Anesthesia: Monitor Anesthesia Care

## 2018-04-08 MED ORDER — SODIUM CHLORIDE 0.9 % IV SOLN: INTRAVENOUS | Status: DC

## 2018-04-08 MED ORDER — METRONIDAZOLE IN NACL 5-0.79 MG/ML-% IV SOLN
500.0000 mg | Freq: Three times a day (TID) | INTRAVENOUS | Status: DC
  Administered 2018-04-08 – 2018-04-10 (×7): 500 mg via INTRAVENOUS
  Filled 2018-04-08 (×7): qty 100

## 2018-04-08 MED ORDER — LACTATED RINGERS IV SOLN
INTRAVENOUS | Status: DC
  Administered 2018-04-08: 1000 mL via INTRAVENOUS

## 2018-04-08 MED ORDER — ACETAMINOPHEN 160 MG/5ML PO SOLN
650.0000 mg | Freq: Four times a day (QID) | ORAL | Status: DC | PRN
  Administered 2018-04-08 – 2018-04-10 (×2): 650 mg
  Filled 2018-04-08 (×2): qty 20.3

## 2018-04-08 MED ORDER — PROPOFOL 10 MG/ML IV BOLUS
INTRAVENOUS | Status: AC
  Filled 2018-04-08: qty 40

## 2018-04-08 MED ORDER — CHLORHEXIDINE GLUCONATE CLOTH 2 % EX PADS
6.0000 | MEDICATED_PAD | Freq: Every day | CUTANEOUS | Status: DC
  Administered 2018-04-08 – 2018-04-10 (×3): 6 via TOPICAL

## 2018-04-08 MED ORDER — ORAL CARE MOUTH RINSE
15.0000 mL | Freq: Two times a day (BID) | OROMUCOSAL | Status: DC
  Administered 2018-04-08 – 2018-04-10 (×3): 15 mL via OROMUCOSAL

## 2018-04-08 MED ORDER — SODIUM CHLORIDE 0.9% FLUSH: 10.0000 mL | INTRAVENOUS | Status: DC | PRN

## 2018-04-08 MED ORDER — PROPOFOL 10 MG/ML IV BOLUS
INTRAVENOUS | Status: DC | PRN
  Administered 2018-04-08: 40 mg via INTRAVENOUS

## 2018-04-08 MED ORDER — PROPOFOL 500 MG/50ML IV EMUL
INTRAVENOUS | Status: DC | PRN
  Administered 2018-04-08: 150 ug/kg/min via INTRAVENOUS

## 2018-04-08 MED ORDER — LIDOCAINE 2% (20 MG/ML) 5 ML SYRINGE
INTRAMUSCULAR | Status: DC | PRN
  Administered 2018-04-08: 100 mg via INTRAVENOUS

## 2018-04-08 NOTE — Op Note (Signed)
[facility] Patient Name: Drew Vega Procedure Date: 04/04/2018 MRN: [id number] Attending MD: Gatha Mayer , MD Date of Birth: Apr 14, 1955 CSN: [id number] Age: 63 Admit Type: Inpatient Procedure:                Upper GI endoscopy Indications:              Hematemesis in seeting of recent XRT for St 4                            esophageal cancer Providers:                Gatha Mayer, MD, Cleda Daub, RN, Charolette Child, Technician, Stephanie [place] CRNA Referring MD:              Medicines:                Propofol per Anesthesia, Monitored Anesthesia Care Complications:            No immediate complications. Estimated Blood Loss:     Estimated blood loss: none. Procedure:                Pre-Anesthesia Assessment:                           - Prior to the procedure, a History and Physical                            was performed, and patient medications and                            allergies were reviewed. The patient's tolerance of                            previous anesthesia was also reviewed. The risks                            and benefits of the procedure and the sedation                            options and risks were discussed with the patient.                            All questions were answered, and informed consent                            was obtained. Prior Anticoagulants: The patient has                            taken no previous anticoagulant or antiplatelet                            agents. ASA Grade Assessment: III - A patient with  severe systemic disease. After reviewing the risks                            and benefits, the patient was deemed in                            satisfactory condition to undergo the procedure.                           After obtaining informed consent, the endoscope was                            passed under direct vision. Throughout the                    procedure, the patient's blood pressure, pulse, and                            oxygen saturations were monitored continuously. The                            [id number]-H190 ([id number] Olympus adult endoscope was                            introduced through the mouth, and advanced to the                            second part of duodenum. The upper GI endoscopy was                            technically difficult and complex due to stricture.                            Successful completion of the procedure was aided by                            withdrawing the scope and replacing with the                            pediatric endoscope. The patient tolerated the                            procedure well. Scope In: Scope Out: Findings:      One cratered esophageal ulcer and stigmata of recent bleeding - large       adherent clot was found in the distal esophagus.      There was evidence of a gastrostomy present on the anterior wall of the       stomach.      One non-bleeding cratered gastric ulcer with no stigmata of bleeding was       found in the cardia.      The exam was otherwise without abnormality.      The cardia and gastric fundus were otherwise normal on retroflexion. Impression:               - Esophageal ulcer. Distal esophagus  ulcerated -                            looks circumferential, large associated clot - no                            active bleeding. 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 Clot left                            alone                           - Gastrostomy present.                           - Normal examined duodenum.                           - No specimens collected. Moderate Sedation:      Not Applicable - Patient had care  per Anesthesia. Recommendation:           - Return patient to ICU for ongoing care.                           Continuous PPI                           NPO for now                           IR consult - ? if they would be able to treat with                            embolization if medical Tx does not work                           Goals of care discussion needed Procedure Code(s):        --- Professional ---                           [phone], Esophagogastroduodenoscopy, flexible,                            transoral; diagnostic, including collection of                            specimen(s) by brushing or washing, when performed                            (  separate procedure) Diagnosis Code(s):        --- Professional ---                           K22.10, Ulcer of esophagus without bleeding                           Z93.1, Gastrostomy status                           K92.0, Hematemesis CPT copyright 2018 [employer]. All rights reserved. The codes documented in this report are preliminary and upon coder review may  be revised to meet current compliance requirements. Gatha Mayer, MD 04/21/2018 12:02:25 PM This report has been signed electronically. Number of Addenda: 0

## 2018-04-08 NOTE — Anesthesia Postprocedure Evaluation (Signed)
Anesthesia Post Note  Patient: Drew Vega  Procedure(s) Performed: ESOPHAGOGASTRODUODENOSCOPY (EGD) (N/A )     Patient location during evaluation: PACU Anesthesia Type: MAC Level of consciousness: awake and alert Pain management: pain level controlled Vital Signs Assessment: post-procedure vital signs reviewed and stable Respiratory status: spontaneous breathing, nonlabored ventilation, respiratory function stable and patient connected to nasal cannula oxygen Cardiovascular status: stable and blood pressure returned to baseline Postop Assessment: no apparent nausea or vomiting Anesthetic complications: no    Last Vitals:  Vitals:   04/15/2018 1209 04/17/2018 1213  BP:  112/88  Pulse: 94 93  Resp:  (!) 21  Temp:    SpO2: 98% 96%    Last Pain:  Vitals:   04/16/2018 1209  TempSrc:   PainSc: 0-No pain                 Effie Berkshire

## 2018-04-08 NOTE — Progress Notes (Signed)
  Echocardiogram 2D Echocardiogram has been performed.  Phelan Goers L Androw 04/07/2018, 9:32 AM

## 2018-04-08 NOTE — Progress Notes (Signed)
[facility] PROGRESS NOTE  Drew Vega [id number]:[id number] DOB: 12/10/1955 DOA: 04/10/2018  PCP: [facility]  Brief History/Interval Summary: 63 y.o. male with a past medical history of metastatic esophageal cancer who completed radiation treatment on 1/8.  Plan was to initiate chemotherapy in the near future.  He received his radiation treatments here at the [facility] cancer center.  He was supposed to start chemotherapy at the [facility] in [place].    Patient presented after he had a syncopal episode at home associated with hematemesis.  Found to be febrile and hypotensive with significant anemia.  He was hospitalized for further management.    Reason for Visit: Blood loss anemia.  GI bleeding.  Septic shock  Consultants: Gastroenterology  Procedures: None yet  Antibiotics: On vancomycin cefepime and metronidazole  Subjective/Interval History: Patient states that overall he does feel better.  He has been up to the bedside commode and has not had any dizziness or lightheadedness.  However he does report blood in the stool.  Some confusion regarding this.  Apparently he did have bright red blood per rectum at least once.  According to nursing staff he also had black stool.  He has had 3 bowel movements since he has been in the stepdown unit.  ROS: Denies any shortness of breath.  Objective:  Vital Signs  Vitals:   04/16/2018 0445 04/18/2018 0500 04/01/2018 0515 04/06/2018 0526  BP: 106/70 122/66 118/71 128/76  Pulse: 93 94 93 93  Resp: (!) 23 15 (!) 22 (!) 21  Temp:    97.9 F (36.6 C)  TempSrc:    Oral  SpO2: 96% 99% 98% 98%  Weight:      Height:        Intake/Output Summary (Last 24 hours) at 04/17/2018 0834 Last data filed at 04/11/2018 0526 Gross per 24 hour  Intake 4586.09 ml  Output 80 ml  Net 4506.09 ml   Filed Weights   04/17/2018 1738  Weight: 71.8 kg    General appearance: alert, cooperative, appears stated age and no distress Resp: clear to auscultation  bilaterally Cardio: regular rate and rhythm, S1, S2 normal, no murmur, click, rub or gallop.  Port-A-Cath in the right chest.  No erythema. GI: Abdomen soft.  Mildly tender in the right upper quadrant with hepatomegaly.  Bowel sounds present normal.  No masses organomegaly.. PEG tube is noted. Extremities: extremities normal, atraumatic, no cyanosis or edema Pulses: 2+ and symmetric His entire body covered with tattoos. Neurologic: Grossly normal  Lab Results:  Data Reviewed: I have personally reviewed following labs and imaging studies  CBC: Recent Labs  Lab 04/11/2018 1136 04/25/2018 1628 04/06/2018 2000 04/25/2018 0600  WBC 22.3* 22.5* 18.8* 19.3*  NEUTROABS 18.0* 18.3*  --   --   HGB 8.2* 5.5* 6.5* 8.2*  HCT 25.3* 17.2* 19.6* 24.3*  MCV 92.3 93.5 92.5 90.0  PLT 98* 99* 88* 96*    Basic Metabolic Panel: Recent Labs  Lab 04/11/2018 1136 04/21/2018 0600  NA 128* 132*  K 4.0  --   CL 95*  --   CO2 23  --   GLUCOSE 148*  --   BUN 24*  --   CREATININE 0.92  --   CALCIUM 7.6*  --     GFR: Estimated Creatinine Clearance: 77.8 mL/min (by C-G formula based on SCr of 0.92 mg/dL).  Liver Function Tests: Recent Labs  Lab 04/21/2018 1136  AST 56*  ALT 48*  ALKPHOS 575*  BILITOT 0.9  PROT 5.4*  ALBUMIN 2.2*    Coagulation Profile: Recent Labs  Lab 04/29/2018 1811  INR 1.48     Recent Results (from the past 240 hour(s))  Blood Culture (routine x 2)     Status: None (Preliminary result)   Collection Time: 04/08/2018 12:43 PM  Result Value Ref Range Status   Specimen Description   Final    BLOOD LEFT HAND Performed at [facility] [address] [place], [place] [address]    Special Requests   Final    BOTTLES DRAWN AEROBIC AND ANAEROBIC Blood Culture adequate volume Performed at [facility] [address], [place], [place] [address]    Culture   Final    NO GROWTH < 12 HOURS Performed at [facility] [address] [place], [place] [address]    Report Status PENDING  Incomplete  Blood Culture (routine x 2)     Status: None (Preliminary result)   Collection Time: 04/09/2018  1:20 PM  Result Value Ref Range Status   Specimen Description   Final    BLOOD RIGHT PORTA CATH CHEST Performed at [facility] [address], [place], [place] [address]    Special Requests   Final    BOTTLES DRAWN AEROBIC AND ANAEROBIC Blood Culture adequate volume Performed at [facility] [address] [place], [place] [address]    Culture   Final    NO GROWTH < 12 HOURS Performed at [facility] [address], [place], [place] [address]    Report Status PENDING  Incomplete  MRSA PCR Screening     Status: None   Collection Time: 04/09/2018  6:48 PM  Result Value Ref Range Status   MRSA by PCR NEGATIVE NEGATIVE Final    Comment:        The GeneXpert MRSA Assay (FDA approved for NASAL specimens only), is one component of a comprehensive MRSA colonization surveillance program. It is not intended to diagnose MRSA infection nor to guide or monitor treatment for MRSA infections. Performed at [facility], [place] [address], [place], [place] [address]       Radiology Studies: Dg Chest 2 View  Result Date: 03/31/2018 CLINICAL DATA:  Syncope with fall.  Reported esophageal carcinoma EXAM: CHEST - 2 VIEW COMPARISON:  None. FINDINGS: Port-A-Cath tip is at the cavoatrial junction. No pneumothorax. No edema or consolidation. Heart size and pulmonary vascularity are normal. No adenopathy. No acute fracture evident. Multiple metallic pellets are noted on the left. IMPRESSION: No edema or consolidation.  No adenopathy.  No mediastinal widening. Metallic pellets on the left noted. Port-A-Cath tip at cavoatrial junction. No pneumothorax. Electronically Signed   By: Lowella Grip III M.D.   On: 04/15/2018 12:11   Ct Head Wo Contrast  Result Date: 04/06/2018 CLINICAL  DATA:  Syncope with fall EXAM: CT HEAD WITHOUT CONTRAST TECHNIQUE: Contiguous axial images were obtained from the base of the skull through the vertex without intravenous contrast. COMPARISON:  None. FINDINGS: Brain: There is slight diffuse atrophy. There is no intracranial mass, hemorrhage, extra-axial fluid collection, or midline shift. The brain parenchyma appears unremarkable. There is no demonstrable acute infarct. Vascular: There is no hyperdense vessel. There is no appreciable vascular calcification. Skull: The bony calvarium appears intact. Sinuses/Orbits: There is mucosal thickening in several ethmoid air cells. Other paranasal sinuses are clear. There is rightward deviation of the nasal septum. Orbits appear symmetric bilaterally. Other: The mastoid air cells  are clear. IMPRESSION: Slight diffuse atrophy. Brain parenchyma appears unremarkable. No mass or hemorrhage. There is mild mucosal thickening in several ethmoid air cells. There is deviation of the nasal septum toward the right. Electronically Signed   By: Lowella Grip III M.D.   On: 04/16/2018 12:10   Ct Abdomen Pelvis W Contrast  Result Date: 04/10/2018 CLINICAL DATA:  63 year old male with a history of esophageal cancer. Syncope EXAM: CT ABDOMEN AND PELVIS WITH CONTRAST TECHNIQUE: Multidetector CT imaging of the abdomen and pelvis was performed using the standard protocol following bolus administration of intravenous contrast. CONTRAST:  128mL ISOVUE-300 IOPAMIDOL (ISOVUE-300) INJECTION 61% COMPARISON:  12/09/2008, 02/25/2005 FINDINGS: Lower chest: Ground-glass opacity at the periphery of the right lower lobe continuous with the diaphragm. No pleural effusion. Abnormal soft tissue at the distal esophagus near the GE junction in this patient with a given history of esophageal carcinoma. Hepatobiliary: Current CT demonstrates multiple bilateral liver masses. The largest are within the right liver, at the segment 5/6 junction measuring 5.1 cm  x 6.7 cm. None of these were present on the comparison CT exam. Trace intrahepatic biliary dilatation of the left without radiopaque stones. Gallbladder relatively decompressed with no radiopaque stones. No extrahepatic biliary ductal dilatation or radiopaque stones. Pancreas: Unremarkable pancreas Spleen: Hypodense/hypoenhancing lesion at the periphery of the spleen measuring 1.1 cm which has more uniform attenuation to spleen on the delayed images. This lesion not present on the comparison CT. Adrenals/Urinary Tract: Unremarkable adrenal glands Rounded stone within the right renal pelvis measuring 12 mm. This was not present on the comparison CT. No evidence of hydronephrosis. Mild inflammatory change/edema in the fat at the right renal pelvis. Unremarkable course of the right ureter with no additional stones. Subcentimeter hypodense cyst at the lateral cortex of the right kidney without evidence of enhancement. No evidence of left-sided hydronephrosis. Bosniak 2 cyst at the inferior left kidney. Unremarkable course of the left ureter. Urinary bladder is decompressed with circumferential wall thickening. Stomach/Bowel: Abnormal circumferential thickening of the distal esophagus with small nodularity within the adjacent fat extending through the hiatus. Balloon retention gastrostomy terminates within the stomach. Small bowel unremarkable without abnormal distention or focal thickening. No inflammatory changes. Normal appendix. Moderate stool burden without associated inflammation. No transition point or evidence of obstruction. Vascular/Lymphatic: Minimal atherosclerotic changes of the vasculature. No aneurysm. Small lymph nodes at the portacaval nodal station and at the liver hilum. No significant retroperitoneal adenopathy. Trace free fluid within the anatomic pelvis. Reproductive: Transverse diameter of the prostate estimated 19 mm Other: Small fat containing umbilical hernia Musculoskeletal: Degenerative  changes of the visualized thoracolumbar spine. No significant bony canal narrowing. Multilevel degenerative disc disease degenerative changes of the bilateral hips. IMPRESSION: No CT finding to account for syncope. 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 Irregular soft tissue thickening at the distal esophagus and GE junction, compatible with carcinoma in this patient with given history of esophageal carcinoma. Small nodularity within the adjacent fat and evidence of likely pathologic lymph nodes at the liver hilum. Hypodense lesion at the periphery of the spleen, potentially additional metastatic disease versus benign lesion such as infarct. 12 mm stone within the right renal pelvis with mild associated inflammation and no hydronephrosis. If there is concern for infection/pyelonephritis, recommend correlation with urinalysis. Appropriately positioned balloon retention percutaneous gastrostomy tube. Aortic Atherosclerosis (ICD10-I70.0). Trace free fluid in the anatomic pelvis, likely reactive. Electronically Signed   By: Corrie Mckusick D.O.   On: 04/11/2018  15:16     Medications:  Scheduled: . sodium chloride   Intravenous Once  . Chlorhexidine Gluconate Cloth  6 each Topical Daily  . free water  100 mL Per Tube Q8H  . mouth rinse  15 mL Mouth Rinse BID  . [START ON 04/11/2018] pantoprazole  40 mg Intravenous Q12H   Continuous: . sodium chloride 100 mL/hr at 04/09/2018 0505  . ceFEPime (MAXIPIME) IV Stopped (04/21/2018 2232)  . metronidazole    . pantoprozole (PROTONIX) infusion 8 mg/hr (04/26/2018 0505)  . vancomycin Stopped (04/17/2018 0136)   JIR:[id number] **OR** acetaminophen, ondansetron **OR** ondansetron (ZOFRAN) IV, sodium chloride flush    Assessment/Plan:  Syncope Most likely secondary to orthostatic hypotension or vasovagal syndrome.  Patient has not had any further episodes in the hospital.  His blood pressures have improved.   Continue to monitor.  Low suspicion for venous thromboembolism.  Echocardiogram is pending.  CT head did not show any acute intracranial findings.  Sepsis with septic shock Patient with elevated WBC, fever, tachycardia and hypotension.  Lactic acid level was elevated.  All these parameters have improved.  Blood pressure has significantly improved.  WBC is still high but better.  Continue broad-spectrum antibiotics.  No obvious source of infection identified.  Follow-up on blood cultures. UA looks clear.  No infiltrates on chest x-ray.  CT scan of the abdomen does not show any obvious infectious source.  He does have a Port-A-Cath which was placed back in November.  No erythema noted around that site.  ProCalcitonin elevated at 1.84.  GI bleeding Patient mention one episode of hematemesis at home.  And then it appears that overnight he has had a combination of melanotic stool as well as bright red blood per rectum.  CT scan of the abdomen did not show any acute findings.  His hemoglobin did drop down to 5.5.  Some of which may have been dilutional.  He was transfused 3 units of blood with improvement in his hemoglobin.  GI is following.  Plan is for upper endoscopy.  Wonder if the bright red blood per rectum was due to brisk transit of blood from upper GI tract.  He had a recent colonoscopy in November.  Patient's wife had the report.  2 polyps were removed.  Otherwise no other abnormalities were noted.  Continue PPI infusion.  Acute blood loss anemia He is status post 3 units of PRBC.  Hemoglobin has improved.  Continue to monitor.  Thrombocytopenia Platelet count stable.  Low levels could be due to sepsis or liver metastases.  History of esophageal cancer with liver metastases/abnormal LFTs Patient recently completed radiation treatment.  Plan was to start chemotherapy in the near future at the [facility] in [place].  Followed here by radiation oncology Dr. Lisbeth Renshaw.  Patient has a PEG tube.  Holding off  on tube feedings for now.  Hyponatremia Sodium level has improved.  Rest of the labs are pending.  Nephrolithiasis No stone was noted in the right renal pelvis.  Some concern for inflammation on CT scan but the UA does not suggest any infection.  DVT Prophylaxis: SCDs    Code Status: Full code Family Communication: Discussed with the patient and his wife Disposition Plan: Management as outlined above.  Await further GI work-up.    LOS: 1 day   [facility] Pager [phone] 04/28/2018, 8:34 AM  If 7PM-7AM, please contact night-coverage at [email or web address], password [facility]

## 2018-04-08 NOTE — Progress Notes (Signed)
   04/15/2018 1500  Clinical Encounter Type  Visited With Patient and family together  Visit Type Initial;Psychological support;Spiritual support;Critical Care  Referral From Family  Consult/Referral To Chaplain  Spiritual Encounters  Spiritual Needs Emotional;Other (Comment) (Spiritual Care Conversation)  Stress Factors  Patient Stress Factors Health changes;Major life changes  Family Stress Factors Health changes;Major life changes   I visited with the patient per referral from the neighbor. No needs were present at the time of my visit.   Please, contact Spiritual Care for further assistance.  Chaplain Shanon Ace M.Div., [facility]

## 2018-04-08 NOTE — Progress Notes (Signed)
Much better clinically this morning after 3 units PRBC's.  Hgb 8.2 grams and VSS.  Will plan for EGD later this morning.  Does report passing some black stools since admission.

## 2018-04-08 NOTE — Progress Notes (Signed)
Pt had a 10-12 sec burst of SVT. Notified by CCMD. Endo and MD made aware. Will continue to monitor. Pt. Asymptomatic.

## 2018-04-08 NOTE — Anesthesia Preprocedure Evaluation (Addendum)
Anesthesia Evaluation  Patient identified by MRN, date of birth, ID band Patient awake    Reviewed: Allergy & Precautions, NPO status , Patient's Chart, lab work & pertinent test results  Airway Mallampati: I  TM Distance: >3 FB Neck ROM: Full    Dental  (+) Teeth Intact, Dental Advisory Given   Pulmonary neg pulmonary ROS,    breath sounds clear to auscultation       Cardiovascular hypertension, Pt. on medications and Pt. on home beta blockers  Rhythm:Regular Rate:Normal     Neuro/Psych negative neurological ROS     GI/Hepatic negative GI ROS, Neg liver ROS, GERD  Medicated,  Endo/Other  negative endocrine ROS  Renal/GU negative Renal ROS     Musculoskeletal negative musculoskeletal ROS (+)   Abdominal Normal abdominal exam  (+)   Peds  Hematology   Anesthesia Other Findings   Reproductive/Obstetrics                            Anesthesia Physical Anesthesia Plan  ASA: III  Anesthesia Plan: MAC   Post-op Pain Management:    Induction: Intravenous  PONV Risk Score and Plan: 1 and Ondansetron and Propofol infusion  Airway Management Planned: Natural Airway and Nasal Cannula  Additional Equipment: None  Intra-op Plan:   Post-operative Plan:   Informed Consent: I have reviewed the patients History and Physical, chart, labs and discussed the procedure including the risks, benefits and alternatives for the proposed anesthesia with the patient or authorized representative who has indicated his/her understanding and acceptance.     Plan Discussed with: CRNA  Anesthesia Plan Comments:       Anesthesia Quick Evaluation

## 2018-04-08 NOTE — Progress Notes (Signed)
Pt. Wife has brought in previous imaging of Endoscopies since 01/2018. Images and documents copied and placed in chart for ease of access.

## 2018-04-08 NOTE — Interval H&P Note (Signed)
History and Physical Interval Note:  04/13/2018 11:18 AM  Drew Vega  has presented today for surgery, with the diagnosis of Anemia, hematemesis  The various methods of treatment have been discussed with the patient and family. After consideration of risks, benefits and other options for treatment, the patient has consented to  Procedure(s): ESOPHAGOGASTRODUODENOSCOPY (EGD) (N/A) as a surgical intervention .  The patient's history has been reviewed, patient examined, no change in status, stable for surgery.  I have reviewed the patient's chart and labs.  Questions were answered to the patient's satisfaction.     Silvano Rusk

## 2018-04-08 NOTE — Progress Notes (Signed)
Pt. Had 3 maroon colored red bloody stools.

## 2018-04-08 NOTE — Transfer of Care (Signed)
Immediate Anesthesia Transfer of Care Note  Patient: Drew Vega  Procedure(s) Performed: ESOPHAGOGASTRODUODENOSCOPY (EGD) (N/A )  Patient Location: PACU and Endoscopy Unit  Anesthesia Type:MAC  Level of Consciousness: awake and drowsy  Airway & Oxygen Therapy: Patient Spontanous Breathing and Patient connected to nasal cannula oxygen  Post-op Assessment: Report given to RN and Post -op Vital signs reviewed and stable  Post vital signs: Reviewed and stable  Last Vitals:  Vitals Value Taken Time  BP 133/68 04/29/2018 11:52 AM  Temp    Pulse 95 04/29/2018 11:53 AM  Resp 30 04/01/2018 11:53 AM  SpO2 93 % 04/22/2018 11:53 AM  Vitals shown include unvalidated device data.  Last Pain:  Vitals:   04/11/2018 1100  TempSrc: Oral  PainSc: 0-No pain         Complications: No apparent anesthesia complications
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
## 2018-04-08 NOTE — Progress Notes (Signed)
Initial Nutrition Assessment  DOCUMENTATION CODES:   Not applicable  INTERVENTION:  - Once TF able to be initiated, recommend: 1 carton Osmolite 1.5 x6/day with 30 mL Prostat once/day and 50 mL free water before and after each TF bolus. This regimen will provide 2230 kcal, 104 grams of protein, and 1686 mL free water.   NUTRITION DIAGNOSIS:   Increased nutrient needs related to catabolic illness, chronic illness, cancer and cancer related treatments as evidenced by estimated needs.   GOAL:   Patient will meet greater than or equal to 90% of their needs  MONITOR:   Weight trends, Labs, I & O's, Other (Comment)(Ability to start TF)  REASON FOR ASSESSMENT:   Malnutrition Screening Tool  ASSESSMENT:   63 y.o. male with a past medical history of metastatic esophageal cancer who completed radiation treatment 04/06/2018. Plan was to initiate chemotherapy in the near future at the [place] in [place]. He had a PEG tube placed recently and started tube feedings ~1 week PTA. After he gave himself tube feeding he felt nauseated. He rushed to the sink and vomited a small quantity of blood and then he passed out. He does have some right-sided abdominal pain.  BMI indicates normal weight. Patient has been NPO since admission. He reports that at home he is able to do ice chips, water, and other thin liquids in small sips. He reports PEG was placed 1 week ago and that bolus TF regimen had been going well at home. No abdominal pain/pressure, bloating, or nausea associated with TF until the day PTA. He denies any redness around PEG site.   He confirms that he receives 1 can Nutren 1.5 six times/day with 90 ml water flush before and 90 ml water flush after each bolus. He is unsure what time he gets his first TF bolus in the morning and states that they are "every few hours until I have gotten in 6 for the day).  This TF regimen provides 2250 kcal, 102 grams of protein, and 2226 mL free water. Informed  patient that Nutren 1.5 is a English as a second language teacher product and that comparable available here is Osmolite 1.5, which is made by [employer]. TF recommendations outlined above. Current order in place for 100 mL free water via PEG TID.   Per chart review, current weight is 158 lb and weight on 03/08/18 was 160 lb. This indicates 2 lb weight loss (1.2% body weight) in the past 1 month; not significant for time frame. No previous weight hx available in the chart. Unable to obtain PTA weight information from patient at this time.   Patient had EGD done this AM which showed esophageal ulcer, ulceration to the distal esophagus with associated large clot, no active bleeding, normal stomach and duodenum.    Medications reviewed; 8 mg/hr protonix. Labs reviewed; Na: 132 mmol/L, BUN: 35 mg/dL, Ca: 7.3 mg/dL, LFTs elevated. IVF; NS @ 100 mL/hr.       NUTRITION - FOCUSED PHYSICAL EXAM:    Most Recent Value  Orbital Region  No depletion  Upper Arm Region  No depletion  Thoracic and Lumbar Region  Unable to assess  Buccal Region  No depletion  Temple Region  No depletion  Clavicle Bone Region  No depletion  Clavicle and Acromion Bone Region  No depletion  Scapular Bone Region  Unable to assess  Dorsal Hand  No depletion  Patellar Region  Unable to assess  Anterior Thigh Region  Unable to assess  Posterior Calf Region  Unable to  assess  Edema (RD Assessment)  Unable to assess  Hair  Reviewed  Eyes  Reviewed  Mouth  Reviewed  Skin  Reviewed  Nails  Reviewed       Diet Order:   Diet Order            Diet NPO time specified  Diet effective now              EDUCATION NEEDS:   No education needs have been identified at this time  Skin:  Skin Assessment: Reviewed RN Assessment  Last BM:  1/10  Height:   Ht Readings from Last 1 Encounters:  04/10/2018 5\' 7"  (1.702 m)    Weight:   Wt Readings from Last 1 Encounters:  04/23/2018 71.8 kg    Ideal Body Weight:  67.27 kg  BMI:  Body mass index is  24.79 kg/m.  Estimated Nutritional Needs:   Kcal:  9675-9163 kcal  Protein:  101-115 grams (1.4-1.6 grams/kg)  Fluid:  >/= 2.2 L/day     Jarome Matin, MS, RD, LDN, [facility] Inpatient Clinical Dietitian Pager # [phone] After hours/weekend pager # [phone]

## 2018-04-09 ENCOUNTER — Inpatient Hospital Stay (HOSPITAL_COMMUNITY): Payer: No Typology Code available for payment source

## 2018-04-09 ENCOUNTER — Encounter (HOSPITAL_COMMUNITY): Payer: Self-pay | Admitting: Pulmonary Disease

## 2018-04-09 DIAGNOSIS — K2211 Ulcer of esophagus with bleeding: Secondary | ICD-10-CM

## 2018-04-09 DIAGNOSIS — E872 Acidosis, unspecified: Secondary | ICD-10-CM | POA: Diagnosis not present

## 2018-04-09 DIAGNOSIS — R579 Shock, unspecified: Secondary | ICD-10-CM

## 2018-04-09 DIAGNOSIS — I4901 Ventricular fibrillation: Secondary | ICD-10-CM

## 2018-04-09 DIAGNOSIS — I469 Cardiac arrest, cause unspecified: Secondary | ICD-10-CM

## 2018-04-09 DIAGNOSIS — D649 Anemia, unspecified: Secondary | ICD-10-CM

## 2018-04-09 HISTORY — PX: ARTERIAL LINE INSERTION: CATH118227

## 2018-04-09 LAB — BLOOD GAS, ARTERIAL
Acid-base deficit: 12.9 mmol/L — ABNORMAL HIGH (ref 0.0–2.0)
Acid-base deficit: 3.9 mmol/L — ABNORMAL HIGH (ref 0.0–2.0)
Bicarbonate: 14.7 mmol/L — ABNORMAL LOW (ref 20.0–28.0)
Bicarbonate: 19 mmol/L — ABNORMAL LOW (ref 20.0–28.0)
Drawn by: 11249
Drawn by: 11249
FIO2: 100
FIO2: 40
LHR: 16 {breaths}/min
MECHVT: 530 mL
MECHVT: 530 mL
O2 Saturation: 99.5 %
O2 Saturation: 99.3 %
PEEP/CPAP: 5 cmH2O
PEEP: 5 cmH2O
PO2 ART: 502 mmHg — AB (ref 83.0–108.0)
Patient temperature: 99.4
Patient temperature: 37.9
RATE: 20 resp/min
pCO2 arterial: 44.1 mmHg (ref 32.0–48.0)
pCO2 arterial: 29.6 mmHg — ABNORMAL LOW (ref 32.0–48.0)
pH, Arterial: 7.153 — CL (ref 7.350–7.450)
pH, Arterial: 7.428 (ref 7.350–7.450)
pO2, Arterial: 165 mmHg — ABNORMAL HIGH (ref 83.0–108.0)

## 2018-04-09 LAB — COMPREHENSIVE METABOLIC PANEL
ALT: 48 U/L — ABNORMAL HIGH (ref 0–44)
ALT: 52 U/L — ABNORMAL HIGH (ref 0–44)
AST: 80 U/L — ABNORMAL HIGH (ref 15–41)
AST: 96 U/L — ABNORMAL HIGH (ref 15–41)
Albumin: 2.1 g/dL — ABNORMAL LOW (ref 3.5–5.0)
Albumin: 2.1 g/dL — ABNORMAL LOW (ref 3.5–5.0)
Alkaline Phosphatase: 404 U/L — ABNORMAL HIGH (ref 38–126)
Alkaline Phosphatase: 446 U/L — ABNORMAL HIGH (ref 38–126)
Anion gap: 8 (ref 5–15)
Anion gap: 13 (ref 5–15)
BILIRUBIN TOTAL: 1.3 mg/dL — AB (ref 0.3–1.2)
BUN: 24 mg/dL — ABNORMAL HIGH (ref 8–23)
BUN: 20 mg/dL (ref 8–23)
CHLORIDE: 106 mmol/L (ref 98–111)
CO2: 20 mmol/L — AB (ref 22–32)
CO2: 18 mmol/L — ABNORMAL LOW (ref 22–32)
Calcium: 7.4 mg/dL — ABNORMAL LOW (ref 8.9–10.3)
Calcium: 7.8 mg/dL — ABNORMAL LOW (ref 8.9–10.3)
Chloride: 107 mmol/L (ref 98–111)
Creatinine, Ser: 0.84 mg/dL (ref 0.61–1.24)
Creatinine, Ser: 1.03 mg/dL (ref 0.61–1.24)
GFR calc Af Amer: >60 mL/min (ref >60)
GFR calc Af Amer: >60 mL/min (ref >60)
GFR calc non Af Amer: >60 mL/min (ref >60)
GFR calc non Af Amer: >60 mL/min (ref >60)
Glucose, Bld: 103 mg/dL — ABNORMAL HIGH (ref 70–99)
Glucose, Bld: 152 mg/dL — ABNORMAL HIGH (ref 70–99)
POTASSIUM: 4.5 mmol/L (ref 3.5–5.1)
Potassium: 3.5 mmol/L (ref 3.5–5.1)
Sodium: 134 mmol/L — ABNORMAL LOW (ref 135–145)
Sodium: 138 mmol/L (ref 135–145)
Total Bilirubin: 1.2 mg/dL (ref 0.3–1.2)
Total Protein: 5 g/dL — ABNORMAL LOW (ref 6.5–8.1)
Total Protein: 5.3 g/dL — ABNORMAL LOW (ref 6.5–8.1)

## 2018-04-09 LAB — CBC WITH DIFFERENTIAL/PLATELET
Abs Immature Granulocytes: 2.53 10*3/uL — ABNORMAL HIGH (ref 0.00–0.07)
Basophils Absolute: 0.2 10*3/uL — ABNORMAL HIGH (ref 0.0–0.1)
Basophils Relative: 1 %
Eosinophils Absolute: 0.4 10*3/uL (ref 0.0–0.5)
Eosinophils Relative: 1 %
HCT: 29.7 % — ABNORMAL LOW (ref 39.0–52.0)
Hemoglobin: 9.2 g/dL — ABNORMAL LOW (ref 13.0–17.0)
IMMATURE GRANULOCYTES: 7 %
Lymphocytes Relative: 5 %
Lymphs Abs: 1.9 10*3/uL (ref 0.7–4.0)
MCH: 30 pg (ref 26.0–34.0)
MCHC: 31 g/dL (ref 30.0–36.0)
MCV: 96.7 fL (ref 80.0–100.0)
Monocytes Absolute: 3.7 10*3/uL — ABNORMAL HIGH (ref 0.1–1.0)
Monocytes Relative: 11 %
NEUTROS ABS: 26.6 10*3/uL — AB (ref 1.7–7.7)
Neutrophils Relative %: 75 %
Platelets: 69 10*3/uL — ABNORMAL LOW (ref 150–400)
RBC: 3.07 MIL/uL — ABNORMAL LOW (ref 4.22–5.81)
RDW: 14.4 % (ref 11.5–15.5)
WBC: 35.2 10*3/uL — ABNORMAL HIGH (ref 4.0–10.5)
nRBC: 0.1 % (ref 0.0–0.2)

## 2018-04-09 LAB — CBC
HCT: 23.6 % — ABNORMAL LOW (ref 39.0–52.0)
HCT: 22.4 % — ABNORMAL LOW (ref 39.0–52.0)
Hemoglobin: 7.7 g/dL — ABNORMAL LOW (ref 13.0–17.0)
Hemoglobin: 7.2 g/dL — ABNORMAL LOW (ref 13.0–17.0)
MCH: 30.1 pg (ref 26.0–34.0)
MCH: 30.5 pg (ref 26.0–34.0)
MCHC: 32.6 g/dL (ref 30.0–36.0)
MCHC: 32.1 g/dL (ref 30.0–36.0)
MCV: 92.2 fL (ref 80.0–100.0)
MCV: 94.9 fL (ref 80.0–100.0)
PLATELETS: 66 10*3/uL — AB (ref 150–400)
Platelets: 56 10*3/uL — ABNORMAL LOW (ref 150–400)
RBC: 2.56 MIL/uL — AB (ref 4.22–5.81)
RBC: 2.36 MIL/uL — ABNORMAL LOW (ref 4.22–5.81)
RDW: 14.4 % (ref 11.5–15.5)
RDW: 14.5 % (ref 11.5–15.5)
WBC: 20.6 10*3/uL — ABNORMAL HIGH (ref 4.0–10.5)
WBC: 21.3 10*3/uL — ABNORMAL HIGH (ref 4.0–10.5)
nRBC: 0 % (ref 0.0–0.2)
nRBC: 0 % (ref 0.0–0.2)

## 2018-04-09 LAB — TROPONIN I: Troponin I: 0.92 ng/mL (ref <0.03)

## 2018-04-09 LAB — GLUCOSE, CAPILLARY: Glucose-Capillary: 124 mg/dL — ABNORMAL HIGH (ref 70–99)

## 2018-04-09 LAB — PREPARE RBC (CROSSMATCH)

## 2018-04-09 LAB — PHOSPHORUS: Phosphorus: 4 mg/dL (ref 2.5–4.6)

## 2018-04-09 LAB — PREALBUMIN: Prealbumin: <5 mg/dL — ABNORMAL LOW (ref 18–38)

## 2018-04-09 LAB — TRIGLYCERIDES: Triglycerides: 109 mg/dL (ref <150)

## 2018-04-09 LAB — MAGNESIUM: Magnesium: 2.3 mg/dL (ref 1.7–2.4)

## 2018-04-09 LAB — LACTIC ACID, PLASMA: Lactic Acid, Venous: 7 mmol/L (ref 0.5–1.9)

## 2018-04-09 MED ORDER — FENTANYL CITRATE (PF) 100 MCG/2ML IJ SOLN: 100.0000 ug | INTRAMUSCULAR | Status: DC | PRN

## 2018-04-09 MED ORDER — METOPROLOL TARTRATE 5 MG/5ML IV SOLN
5.0000 mg | Freq: Once | INTRAVENOUS | Status: AC
  Administered 2018-04-09: 5 mg via INTRAVENOUS

## 2018-04-09 MED ORDER — METOPROLOL TARTRATE 5 MG/5ML IV SOLN
5.0000 mg | Freq: Four times a day (QID) | INTRAVENOUS | Status: DC
  Administered 2018-04-10: 5 mg via INTRAVENOUS
  Filled 2018-04-09: qty 5

## 2018-04-09 MED ORDER — SODIUM CHLORIDE 0.9% IV SOLUTION: Freq: Once | INTRAVENOUS | Status: DC

## 2018-04-09 MED ORDER — PROPOFOL 1000 MG/100ML IV EMUL
0.0000 ug/kg/min | INTRAVENOUS | Status: DC
  Administered 2018-04-09: 15 ug/kg/min via INTRAVENOUS
  Administered 2018-04-10: 35 ug/kg/min via INTRAVENOUS
  Administered 2018-04-10: 15 ug/kg/min via INTRAVENOUS
  Filled 2018-04-09 (×2): qty 100

## 2018-04-09 MED ORDER — LACTATED RINGERS IV BOLUS
1000.0000 mL | Freq: Once | INTRAVENOUS | Status: AC
  Administered 2018-04-09: 1000 mL via INTRAVENOUS

## 2018-04-09 MED ORDER — MORPHINE SULFATE (PF) 2 MG/ML IV SOLN
2.0000 mg | Freq: Once | INTRAVENOUS | Status: AC
  Administered 2018-04-09: 2 mg via INTRAVENOUS
  Filled 2018-04-09: qty 1

## 2018-04-09 MED ORDER — SODIUM CHLORIDE 0.9% IV SOLUTION
Freq: Once | INTRAVENOUS | Status: AC
  Administered 2018-04-09: 23:00:00 via INTRAVENOUS

## 2018-04-09 MED ORDER — DILTIAZEM HCL-DEXTROSE 100-5 MG/100ML-% IV SOLN (PREMIX)
5.0000 mg/h | INTRAVENOUS | Status: DC
  Administered 2018-04-09: 5 mg/h via INTRAVENOUS
  Filled 2018-04-09: qty 100

## 2018-04-09 MED ORDER — SUCRALFATE 1 GM/10ML PO SUSP
1.0000 g | Freq: Four times a day (QID) | ORAL | Status: DC
  Administered 2018-04-09 – 2018-04-10 (×5): 1 g via ORAL
  Filled 2018-04-09 (×5): qty 10

## 2018-04-09 MED ORDER — PROPOFOL 1000 MG/100ML IV EMUL
5.0000 ug/kg/min | INTRAVENOUS | Status: DC
  Administered 2018-04-09: 5 ug/kg/min via INTRAVENOUS
  Filled 2018-04-09: qty 100

## 2018-04-09 MED ORDER — DILTIAZEM LOAD VIA INFUSION
10.0000 mg | Freq: Once | INTRAVENOUS | Status: AC
  Administered 2018-04-09: 10 mg via INTRAVENOUS
  Filled 2018-04-09: qty 10

## 2018-04-09 MED ORDER — METOPROLOL TARTRATE 5 MG/5ML IV SOLN
INTRAVENOUS | Status: AC
  Administered 2018-04-09: 5 mg
  Filled 2018-04-09: qty 5

## 2018-04-09 NOTE — Procedures (Signed)
No note

## 2018-04-09 NOTE — Progress Notes (Signed)
eLink Physician-Brief Progress Note Patient Name: Drew Vega DOB: March 17, 1956 MRN: [id number]   Date of Service  04/09/2018  HPI/Events of Note  ABG on 40%/PRVC 18/TV 530/P 5 =7.428/29.6/165  eICU Interventions  Continue present ventilator management.      Intervention Category Major Interventions: Respiratory failure - evaluation and management;Acid-Base disturbance - evaluation and management  Delsa Walder Eugene 04/09/2018, 11:28 PM

## 2018-04-09 NOTE — Progress Notes (Signed)
Pt complaining of 10/10 burning upper epigastric pain. Maryland Pink, MD notified. Verbal order received for 2mg  IV morphine x1. Orders carried out.

## 2018-04-09 NOTE — Progress Notes (Signed)
Patients wife came out in the hall saying "something is really wrong."  RN ran into patients room at that time patient was unresponsive with increased WOB.  Patients telemetry showed v-fib.  RN called a code.  Dr. Wonda Amis was called in from hallway to run code.  Pt. Placed on backboard.  Patient was shocked x1.  Patients heart stopped and compressions were performed.  At two minute check patient had a heartbeat CPR stopped.  Pt. Intubated at this time.  TRH MD paged and notified of patients change in condition.  Pt. To transfer to [facility] service.

## 2018-04-09 NOTE — Procedures (Signed)
Central Venous Catheter Insertion Procedure Note  Procedure: Insertion of Central Venous Catheter  Indications:  vascular access, hypovolemia, severe bleeding, need for frequent blood draws  Procedure Details  Informed consent was obtained for the procedure, including sedation.  Risks of lung perforation, hemorrhage, arrhythmia, and adverse drug reaction were discussed.   Maximum sterile technique was used including antiseptics, cap, gloves, gown, hand hygiene, mask and sheet.  Under sterile conditions the skin above the at base of throat, on the left subclavian vein was prepped with betadine and covered with a sterile drape. Local anesthesia was applied to the skin and subcutaneous tissues. A 22-gauge needle was used to identify the vein. An 18-gauge needle was then inserted into the vein. A guide wire was then passed easily through the catheter. There were no arrhythmias, occasional PVCs. The catheter was then withdrawn. A 7.0 French triple-lumen was then inserted into the vessel over the guide wire. The catheter was sutured into place.  Findings: There were no changes to vital signs. Catheter was flushed with 10 cc NS. Patient did tolerate procedure well.  Recommendations: CXR ordered to verify placement.  Renee Pain, MD Board Certified by the ABIM, [place]

## 2018-04-09 NOTE — Progress Notes (Addendum)
PCCM Note  I was called urgently to the room for CODE BLUE, cardiac arrest Patient was complaining of burning chest, epigastric pain and then became suddenly unresponsive. Noted to be in V. fib arrest.   Underwent cardioversion x1, 1 amp bicarb and CPR with ROSC, Did not need Epinephrine Total down time approx 2 mins. Patient emergently intubated for agonal respiration  Blood pressure (!) 162/104, pulse 99, temperature 99.4 F (37.4 C), temperature source Oral, resp. rate (!) 8, height '5\' 7"'  (1.702 m), weight 71.8 kg, SpO2 (!) 70 %. 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 Unclear etiology of arrest. May be from ongoing bleed, sepsis  Repeat labs including EKG, troponins, CBC with diff, LA , Met panel CXR, Abg Order echocardiogram Wife updated at bedside  Full consult note to follow.  The patient is critically ill with multiple organ system failure and requires high complexity decision making for assessment and support, frequent evaluation and titration of therapies, advanced monitoring, review of radiographic studies and interpretation of complex data.   Critical Care Time devoted to patient care services, exclusive of separately billable procedures, described in this note is 35  minutes.   Marshell Garfinkel MD [facility] Pager [phone] If no answer call [phone] 04/09/2018, 7:41 PM

## 2018-04-09 NOTE — Consult Note (Addendum)
NAME:  Drew Vega MRN:  [id number] DOB:  [birth year] LOS: 2 ADMISSION DATE:  04/21/2018  CONSULTATION DATE:  04/09/2018 REFERRING MD:  Dr. Bonnielee Haff  REASON FOR CONSULTATION:  Ventricular fibrillation   Initial Pulmonary/Critical Care Consultation  Brief History   N/A  History of present illness   This 63 y.o. 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 He was initially admitted for further evaluation and management of GI bleed and acute blood loss anemia.  Earlier today, the patient complained of burning epigastric pain and suddenly became unresponsive. CODE BLUE was called, and he was found to be in ventricular fibrillation. ROSC was achieved in about 2 minutes (chest compressions, cardioversion, 1 amp bicarbonate). The patient was intubated per ACLS protocol, and the patient has transferred to ICU for further management. The patient is currently on propofol for sedation. Monitor shows atrial fibrillation with rapid ventricular response (VR 130-180s). He is HYPERtensive.  The patient's lowest Hgb recorded during this hospitalization was apparently 5.5. While blood bank shows release of 5 units for transfusion, I cannot find documentation of actual transfusion. There is 1 unit hanging in the room at the time of clinical interview. The nurse is unaware of any additional transfusion history.  REVIEW OF SYSTEMS This patient is critically ill and cannot provide additional history nor review of systems due to mental status/unconsciousness and endotracheally intubated.   Past Medical/Surgical/Social/Family History   Past Medical History:  Diagnosis Date  . Adenocarcinoma of esophagus, stage 4 (HCC)   . Hypertension     Past Surgical History:  Procedure Laterality Date  . ESOPHAGOGASTRODUODENOSCOPY N/A 04/21/2018   Procedure:  ESOPHAGOGASTRODUODENOSCOPY (EGD);  Surgeon: Gatha Mayer, MD;  Location: [facility];  Service: Endoscopy;  Laterality: N/A;  . PEG PLACEMENT  02/2018  . PROSTATECTOMY  2018    Social History   Tobacco Use  . Smoking status: Never Smoker  . Smokeless tobacco: Never Used  Substance Use Topics  . Alcohol use: Yes    Comment: occasional    Family History  Problem Relation Age of Onset  . Throat cancer Mother      Significant Hospital Events   1/9: admitted for syncope, hematemesis. Found to be anemic and hypotensive. 1/10: EGD showed stigmata of esophageal ulcer bleeding (clot). 1/11: CODE BLUE after epigastric pain. V-fib. ROSC in 2 min. Intubated. Tx to ICU.   Consults:  GI Radiology (visceral arteriogram and embolization)   Procedures:  02/2018: PEG placement 1/10: EGD 1/10: visceral arteriogram and embolization  Significant Diagnostic Tests:  1/10: 2D echo LVEF 65-70%.   Micro Data:   Results for orders placed or performed during the hospital encounter of 04/28/2018  Blood Culture (routine x 2)     Status: None (Preliminary result)   Collection Time: 04/15/2018 12:43 PM  Result Value Ref Range Status   Specimen Description   Final    BLOOD LEFT HAND Performed at [facility] [address], [place], [place] [address]    Special Requests   Final    BOTTLES DRAWN AEROBIC AND ANAEROBIC Blood Culture adequate volume Performed at [facility] [address], [place], [place] [address]    Culture   Final    NO GROWTH 2 DAYS Performed at [facility] [address] [place], [place] [address]    Report Status PENDING  Incomplete  Blood  Culture (routine x 2)     Status: None (Preliminary result)   Collection Time: 04/19/2018  1:20 PM  Result Value Ref Range Status   Specimen Description   Final    BLOOD RIGHT PORTA CATH CHEST Performed at [facility] [address], [place], [place] [address]     Special Requests   Final    BOTTLES DRAWN AEROBIC AND ANAEROBIC Blood Culture adequate volume Performed at [facility] [address] [place], [place] [address]    Culture   Final    NO GROWTH 2 DAYS Performed at [facility] [address], [place], [place] [address]    Report Status PENDING  Incomplete  Urine culture     Status: None   Collection Time: 04/28/2018  2:18 PM  Result Value Ref Range Status   Specimen Description   Final    URINE, CLEAN CATCH Performed at [facility], [place] [address] [place], [place] [address]    Special Requests   Final    NONE Performed at [facility], [place] [address] [place], [place] [address]    Culture   Final    NO GROWTH Performed at [facility], [place] [address] [place], [place] [address]    Report Status 04/06/2018 FINAL  Final  MRSA PCR Screening     Status: None   Collection Time: 04/04/2018  6:48 PM  Result Value Ref Range Status   MRSA by PCR NEGATIVE NEGATIVE Final    Comment:        The GeneXpert MRSA Assay (FDA approved for NASAL specimens only), is one component of a comprehensive MRSA colonization surveillance program. It is not intended to diagnose MRSA infection nor to guide or monitor treatment for MRSA infections. Performed at [facility], [place] [address], [place],  [address]       Antimicrobials:  Cefepime/Flagyl (1/11>>   Interim history/subjective:  N/A   Objective   BP (!) 162/104   Pulse 99   Temp 98.4 F (36.9 C) (Axillary)   Resp (!) 8   Ht 5\' 7"  (1.702 m)   Wt 71.8 kg   SpO2 (!) 70%   BMI 24.79 kg/m     Filed Weights   04/24/2018 1738 04/07/2018 1100  Weight: 71.8 kg 71.8 kg    Intake/Output Summary (Last 24 hours) at 04/09/2018 2029 Last data filed at 04/09/2018 1654 Gross per 24 hour  Intake 2110.7 ml  Output -  Net 2110.7 ml    Vent Mode: PRVC FiO2 (%):  [100 %] 100 % Set Rate:  [16  bmp] 16 bmp Vt Set:  [530 mL] 530 mL PEEP:  [5 cmH20] 5 cmH20 Plateau Pressure:  [14 cmH20] 14 cmH20   Examination: GENERAL: Intubated. well-developed, sedated. No acute distress. HEAD: normocephalic, atraumatic. L posterior parietal swelling. EYE: PERRLA, EOM intact, no scleral icterus, no pallor. NOSE: nares are patent. THROAT/ORAL CAVITY: Normal dentition. ETT in situ.  NECK: supple, no thyromegaly, no JVD, no lymphadenopathy. Trachea midline. CHEST/LUNG: R Port-A-Cath. symmetric in development and expansion. Good air entry. Rales and rhonchi bilaterally. HEART: Regular S1 and S2 without murmur, rub or gallop. ABDOMEN: PEG in situ. Soft, nontender, nondistended. Normoactive bowel sounds. No rebound. No guarding. No hepatosplenomegaly. EXTREMITIES: Edema: 1+. No cyanosis. No clubbing. 2+ DP pulses LYMPHATIC: no cervical/axillary/inguinal lymph nodes appreciated MUSCULOSKELETAL: No point tenderness. No bulk atrophy. Joints: normal.  SKIN:  Tattos from neck to toes.  NEUROLOGIC: Doll's eyes intact. Corneal reflex intact. Spontaneous respirations intact. Facial symmetry. Babinski absent. No sensory deficit.  Gait was not assessed.   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 However, ASA and heparin are contraindicated with recent GI bleed. Lopressor 5 mg IV q 6 hr (hold for SBP < 90) Cardizem 10 mg IV bolus and gtt 12-lead EKG Repeat 2D-echo (limited study) for LVEF and LV wall motion Cardiology consult in am   RENAL  Lactic acidosis with incomplete respiratory compensation  Mild hyperkalemia Consider initiation of bicarbonate infusion   GASTROINTESTINAL  GI bleed/esophageal ulcer  Esophageal adenocarcinoma, stage IV (with multiple liver metastases)  GI PROPHYLAXIS: Protonix Check prealbumin   HEMATOLOGIC  Acute on chronic anemia/blood loss  Thrombocytopenia   DVT PROPHYLAXIS: SCDs Monitor CBC Transfuse for Hgb < 8   INFECTIOUS  No acute issues On empiric cefepime/Flagyl Check procalcitonin   ENDOCRINE  No acute issues Check random cortisol   NEUROLOGIC  Encephalopathy, toxometabolic CT head when able. Neurochecks    My assessment, plan of care, findings, medications, side effects, etc. were discussed with: nurse, respiratory therapist and patient's wife.   Best practice:  Diet: NPO Pain/Anxiety/Delirium protocol (if indicated): propofol/fentanyl VAP protocol (if indicated): YES DVT prophylaxis: SCDs GI prophylaxis: Protonix Glucose control: not indicated Mobility/Activity: bed rest CODE STATUS: FULL CODE Family Communication:  wife updated at bedside Disposition: ICU   Labs   CBC: Recent Labs  Lab 04/28/2018 1136 04/14/2018 1628 04/03/2018 2000 04/07/2018 0600 04/25/2018 1418 04/09/18 0400 04/09/18 1200  WBC 22.3* 22.5* 18.8* 19.3* 20.3* 20.6* 21.3*  NEUTROABS 18.0* 18.3*  --   --   --   --   --   HGB 8.2* 5.5* 6.5* 8.2* 8.1* 7.7* 7.2*  HCT 25.3* 17.2* 19.6* 24.3* 24.5* 23.6* 22.4*  MCV 92.3 93.5 92.5 90.0 90.7 92.2 94.9  PLT 98* 99* 88* 96* 92* 66* 56*    Basic Metabolic Panel: Recent Labs  Lab 04/23/2018 1136 04/11/2018 0600 04/09/18 0400    NA 128* 132*  132* 134*  K 4.0 4.3 3.5  CL 95* 103 106  CO2 23 21* 20*  GLUCOSE 148* 104* 103*  BUN 24* 35* 24*  CREATININE 0.92 0.82 0.84  CALCIUM 7.6* 7.3* 7.4*   GFR: Estimated Creatinine Clearance: 85.2 mL/min (by C-G formula based on SCr of 0.84 mg/dL). Recent Labs  Lab 04/18/2018 1144 03/31/2018 1252  04/21/2018 1811  04/15/2018 2046 04/07/2018 0600 04/08/18 1418 04/09/18 0400 04/09/18 1200  PROCALCITON  --   --   --  1.84  --   --   --   --   --   --   WBC  --   --    < >  --    < >  --  19.3* 20.3* 20.6* 21.3*  LATICACIDVEN 2.87* 2.84*  --  3.2*  --  1.7  --   --   --   --    < > = values in this interval not displayed.    Liver Function Tests: Recent Labs  Lab 04/14/2018 1136 04/10/2018 0600 04/09/18 0400  AST 56* 97* 80*  ALT 48* 49* 48*  ALKPHOS 575* 347* 404*  BILITOT 0.9 1.4* 1.2  PROT 5.4* 4.5* 5.0*  ALBUMIN 2.2* 1.9* 2.1*   No results for input(s): LIPASE, AMYLASE in the last 168 hours. No results for input(s): AMMONIA in the last 168 hours.  ABG No results found for: PHART, PCO2ART, PO2ART, HCO3, TCO2, ACIDBASEDEF, O2SAT   Coagulation Profile: Recent Labs  Lab 04/14/2018 1811  INR 1.48    Cardiac Enzymes: No results for input(s): CKTOTAL, CKMB, CKMBINDEX, TROPONINI in the last 168 hours.  HbA1C: No results found for: HGBA1C  CBG: Recent Labs  Lab 04/09/18 1922  GLUCAP 124*     Review of Systems:   See above   Past Medical History   Past Medical History:  Diagnosis Date  . Adenocarcinoma of esophagus, stage 4 (HCC)   . Hypertension     Surgical History    Past Surgical History:  Procedure Laterality Date  . ESOPHAGOGASTRODUODENOSCOPY N/A 04/28/2018   Procedure: ESOPHAGOGASTRODUODENOSCOPY (EGD);  Surgeon: Gatha Mayer, MD;  Location: [facility];  Service: Endoscopy;  Laterality: N/A;  . PROSTATECTOMY  2018     Social History   Social History   Socioeconomic History  . Marital status: Single    Spouse name: Not on file   . Number of children: Not on file  . Years of education: Not on file  . Highest education level: Not on file  Occupational History  . Not on file  Social Needs  . Financial resource strain: Not on file  . Food insecurity:    Worry: Not on file    Inability: Not on file  . Transportation needs:    Medical: No    Non-medical: No  Tobacco Use  . Smoking status: Never Smoker  . Smokeless tobacco: Never Used  Substance and Sexual Activity  . Alcohol use: Yes    Comment: occasional  . Drug use: Not on file  . Sexual activity: Not on file  Lifestyle  . Physical activity:    Days per week: Not on file    Minutes per session: Not on file  . Stress: Not on file  Relationships  . Social connections:    Talks on phone: Not on file    Gets together: Not on file    Attends religious service: Not on file    Active member of club or organization: Not on file    Attends meetings of clubs or organizations: Not on file    Relationship status: Not on file  Other Topics Concern  . Not on file  Social History Narrative  . Not on file     Family History    Family History  Problem Relation Age of Onset  . Throat cancer Mother    family history includes Throat cancer in his mother.    Allergies No Known Allergies    Current Medications  Current Facility-Administered Medications:  .  0.9 %  sodium chloride infusion (Manually program via Guardrails IV Fluids), , Intravenous, Once, Gatha Mayer, MD .  0.9 %  sodium chloride infusion (Manually program via Guardrails IV Fluids), , Intravenous, Once,  Bonnielee Haff, MD .  0.9 %  sodium chloride infusion, , Intravenous, Continuous, Bonnielee Haff, MD, Last Rate: 30 mL/hr at 04/09/18 1654 .  acetaminophen (TYLENOL) solution 650 mg, 650 mg, Per Tube, Q6H PRN, Bonnielee Haff, MD, 650 mg at 04/06/2018 1711 .  ceFEPIme (MAXIPIME) 2 g in sodium chloride 0.9 % 100 mL IVPB, 2 g, Intravenous, Q12H, Gatha Mayer, MD, Stopped at 04/09/18  0950 .  Chlorhexidine Gluconate Cloth 2 % PADS 6 each, 6 each, Topical, Daily, Gatha Mayer, MD, 6 each at 04/09/18 1048 .  [COMPLETED] diltiazem (CARDIZEM) 1 mg/mL load via infusion 10 mg, 10 mg, Intravenous, Once, 10 mg at 04/09/18 2025 **AND** diltiazem (CARDIZEM) 100 mg in dextrose 5% 133mL (1 mg/mL) infusion, 5-15 mg/hr, Intravenous, Continuous, Mannam, Praveen, MD .  free water 100 mL, 100 mL, Per Tube, Q8H, Gatha Mayer, MD, 100 mL at 04/09/18 1351 .  MEDLINE mouth rinse, 15 mL, Mouth Rinse, BID, Gatha Mayer, MD, 15 mL at 04/09/18 1048 .  metoprolol tartrate (LOPRESSOR) 5 MG/5ML injection, , , ,  .  metroNIDAZOLE (FLAGYL) IVPB 500 mg, 500 mg, Intravenous, Q8H, Gatha Mayer, MD, Stopped at 04/09/18 1454 .  ondansetron (ZOFRAN) tablet 4 mg, 4 mg, Oral, Q6H PRN **OR** ondansetron (ZOFRAN) injection 4 mg, 4 mg, Intravenous, Q6H PRN, Gatha Mayer, MD, 4 mg at 04/09/18 0351 .  pantoprazole (PROTONIX) 80 mg in sodium chloride 0.9 % 250 mL (0.32 mg/mL) infusion, 8 mg/hr, Intravenous, Continuous, Gatha Mayer, MD, Last Rate: 25 mL/hr at 04/09/18 1654, 8 mg/hr at 04/09/18 1654 .  [START ON 04/11/2018] pantoprazole (PROTONIX) injection 40 mg, 40 mg, Intravenous, Q12H, Gatha Mayer, MD .  propofol (DIPRIVAN) 1000 MG/100ML infusion, 5-80 mcg/kg/min, Intravenous, Titrated, Anders Simmonds, MD, Last Rate: 2.15 mL/hr at 04/09/18 1956, 5 mcg/kg/min at 04/09/18 1956 .  sodium chloride flush (NS) 0.9 % injection 10-40 mL, 10-40 mL, Intracatheter, PRN, Gatha Mayer, MD .  sucralfate (CARAFATE) 1 GM/10ML suspension 1 g, 1 g, Oral, Q6H, Gatha Mayer, MD, 1 g at 04/09/18 1846  Home Medications  Prior to Admission medications   Medication Sig Start Date End Date Taking? Authorizing Provider  amLODipine (NORVASC) 10 MG tablet Take 10 mg by mouth daily.   Yes [provider]  aspirin EC 81 MG tablet Take 81 mg by mouth daily.   Yes [provider]  atenolol (TENORMIN)  100 MG tablet Take 100 mg by mouth daily.   Yes [provider]  chlorthalidone (HYGROTON) 25 MG tablet Take 12.5 mg by mouth daily. Take one half tablet daily.    Yes [provider]  lisinopril (PRINIVIL,ZESTRIL) 40 MG tablet Take 20 mg by mouth daily.    Yes [provider]  Omega-3 Fatty Acids (FISH OIL) 1000 MG CAPS Take 1,000 mg by mouth daily.   Yes [provider]  omeprazole (PRILOSEC) 20 MG capsule Take 20 mg by mouth daily.   Yes [provider]  sildenafil (VIAGRA) 100 MG tablet Take 100 mg by mouth daily as needed for erectile dysfunction.   Yes [provider]      Critical care time: 90 minutes.  The treatment and management of the patient's condition was required based on the threat of imminent deterioration. This time reflects time spent by the physician evaluating, providing care and managing the critically ill patient's care. The time was spent at the immediate bedside (or on the same floor/unit and dedicated to  this patient's care). Time involved in separately billable procedures is NOT included int he critical care time indicated above. Family meeting and update time may be included above if and only if the patient is unable/incompetent to participate in clinical interview and/or decision making, and the discussion was necessary to determining treatment decisions.    Renee Pain, MD Board Certified by the ABIM, [place] Pager: [phone]

## 2018-04-09 NOTE — Procedures (Signed)
Intubation Procedure Note Drew Vega [id number] 10/25/1955  Procedure: Intubation Indications: Respiratory insufficiency  Procedure Details Consent: Unable to obtain consent because of emergent medical necessity. Time Out: Verified patient identification, verified procedure, site/side was marked, verified correct patient position, special equipment/implants available, medications/allergies/relevent history reviewed, required imaging and test results available.  Performed  Maximum sterile technique was used including cap, gloves, gown and hand hygiene.  Glidescope blade 3. Grade 1 view 1 attempt  Evaluation Hemodynamic Status: BP stable throughout; O2 sats: stable throughout Patient's Current Condition: stable Complications: No apparent complications Patient did tolerate procedure well. Chest X-ray ordered to verify placement.  CXR: pending.   Drew Vega 04/09/2018

## 2018-04-09 NOTE — Progress Notes (Addendum)
Daily Rounding Note  04/09/2018, 9:19 AM  LOS: 2 days   SUBJECTIVE:   Chief complaint: Esophageal cancer, GI bleed from esophageal ulcer. Stools overnight bloody but then became darker, latest bowel movement before sunrise was back and semi-formed.  No vomiting.  Would love to be able to get something to drink.  Has not had any tube feeds since Thursday.  OBJECTIVE:         Vital signs in last 24 hours:    Temp:  [98.2 F (36.8 C)-101.1 F (38.4 C)] 99.2 F (37.3 C) (01/11 0800) Pulse Rate:  [88-107] 98 (01/11 0800) Resp:  [14-31] 24 (01/11 0800) BP: (112-155)/(57-88) 136/67 (01/11 0800) SpO2:  [93 %-100 %] 99 % (01/11 0800) Weight:  [71.8 kg] 71.8 kg (01/10 1100) Last BM Date: 04/09/18 Filed Weights   03/30/2018 1738 04/10/2018 1100  Weight: 71.8 kg 71.8 kg   General: Alert, comfortable, does not look acutely ill/toxic. Heart: RRR Chest: Clear.  Port-A-Cath present on right Abdomen: Soft.  Not tender or distended.  Active bowel sounds.  Fullness in right upper quadrant.  Tube in place. Extremities: No CCE. Neuro/Psych: Oriented x 3.  Moves all 4 limbs.  No gross deficits.  Fully alert.  Lab Results: Recent Labs    04/28/2018 0600 04/22/2018 1418 04/09/18 0400  WBC 19.3* 20.3* 20.6*  HGB 8.2* 8.1* 7.7*  HCT 24.3* 24.5* 23.6*  PLT 96* 92* 66*   BMET Recent Labs    04/23/2018 1136 04/17/2018 0600 04/09/18 0400  NA 128* 132*  132* 134*  K 4.0 4.3 3.5  CL 95* 103 106  CO2 23 21* 20*  GLUCOSE 148* 104* 103*  BUN 24* 35* 24*  CREATININE 0.92 0.82 0.84  CALCIUM 7.6* 7.3* 7.4*   LFT Recent Labs    04/11/2018 1136 04/04/2018 0600 04/09/18 0400  PROT 5.4* 4.5* 5.0*  ALBUMIN 2.2* 1.9* 2.1*  AST 56* 97* 80*  ALT 48* 49* 48*  ALKPHOS 575* 347* 404*  BILITOT 0.9 1.4* 1.2   PT/INR Recent Labs    04/26/2018 1811  LABPROT 17.7*  INR 1.48      ASSESMENT:   *  Melena, limited hematemesis in patient with stage  IV metastatic (to liver, lymph nodes)) esophageal cancer.  Completed radiation treatment 04/06/2017. 04/06/2018 EGD.  Blood clot on distal esophageal ulcer.  Nonbleeding gastric ulcer, no stigmata of bleeding.  Stenosis/stricture proximal to the area of esophageal ulceration traversable only by pediatric gastroscope.  Gastrostomy present.  No endoscopic therapy.  *    Acute blood loss anemia.  S/p transfusions.  Hgb 8.2 >> 6.5 >> 8.2 >> 7.7.    *    Thrombocytopenia.  *    Status post gastric feeding tube 02/2018.  *   Sepsis without clear source of infection.  On cefepime, metronidazole, vancomycin.  PLAN   *   PPI drip.    *   ? IR embolization if unable to stop bleeding with medical therapy    Azucena Freed  04/09/2018, 9:19 AM Phone [facility] Attending   I have taken an interval history, reviewed the chart and examined the patient. I agree with the Advanced Practitioner's note, impression and recommendations.   So far so good and no major bleeding  Allow sips of clears and start carafate slurry  Reviewed situation with patient and wife - hopefully medical Tx will allow him to heal and recover  Gatha Mayer, MD, [facility] 04/09/2018 1:21 PM Pager [phone]

## 2018-04-09 NOTE — Progress Notes (Signed)
[facility] PROGRESS NOTE  Drew Vega UDJ:[id number] DOB: 1955-06-09 DOA: 04/23/2018  PCP: [facility]  Brief History/Interval Summary: 63 y.o. male with a past medical history of metastatic esophageal cancer who completed radiation treatment on 1/8.  Plan was to initiate chemotherapy in the near future.  He received his radiation treatments here at the [facility] cancer center.  He was supposed to start chemotherapy at the [place] in [place].    Patient presented after he had a syncopal episode at home associated with hematemesis.  Found to be febrile and hypotensive with significant anemia.  He was hospitalized for further management.    Reason for Visit: Blood loss anemia.  GI bleeding.  Septic shock  Consultants: Gastroenterology  Procedures:   EGD 04/07/2018 Impression:               - Esophageal ulcer. Distal esophagus ulcerated -                            looks circumferential, large associated clot - no                            active bleeding. 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 Clot left                            alone                           - Gastrostomy present.                           - Normal examined duodenum.                           - No specimens collected.  ECHO Study Conclusions  - Left ventricle: The cavity size was normal. Systolic function was   vigorous. The estimated ejection fraction was in the range of 65%   to 70%. Wall motion was normal; there were no regional wall   motion abnormalities. Left ventricular diastolic function   parameters were normal. - Aortic valve: Valve area (VTI): 2.87 cm^2. Valve area (Vmax): 2.7   cm^2. Valve area (Vmean): 2.38 cm^2.  Antibiotics: On vancomycin cefepime and  metronidazole  Subjective/Interval History: Patient had 3 bowel movements which were apparently maroon in color, there was component of melanotic stool as well based on nursing description.  Patient denies any abdominal pain.  Denies any dizziness or lightheadedness.  His wife is at the bedside.    ROS: Denies any shortness of breath  Objective:  Vital Signs  Vitals:   04/09/18 0800 04/09/18 0900 04/09/18 1000 04/09/18 1100  BP: 136/67 139/75 (!) 145/74 (!) 155/79  Pulse: 98 98 100 (!) 103  Resp: (!) 24 (!) 26 (!) 25 (!) 21  Temp: 99.2 F (37.3 C)     TempSrc: Oral     SpO2: 99% 99% 99% 98%  Weight:      Height:        Intake/Output Summary (Last 24 hours) at 04/09/2018 1150 Last data filed at 04/09/2018 1047 Gross per 24 hour  Intake 2629.13 ml  Output -  Net 2629.13 ml   Filed Weights   04/19/2018 1738 04/17/2018 1100  Weight: 71.8 kg 71.8 kg   General appearance: Awake alert.  In no distress Resp: Clear to auscultation bilaterally.  Normal effort Cardio: S1-S2 is normal regular.  No S3-S4.  No rubs murmurs or bruit GI: Abdomen is soft.  Mildly tender in the right upper quadrant with hepatomegaly.  No rebound rigidity or guarding.  No other masses.  Bowel sounds present.  PEG tube is noted. Extremities: No edema.  Full range of motion of lower extremities. Neurologic: Alert and oriented x3.  No focal neurological deficits.    Lab Results:  Data Reviewed: I have personally reviewed following labs and imaging studies  CBC: Recent Labs  Lab 04/28/2018 1136 04/01/2018 1628 04/20/2018 2000 04/14/2018 0600 04/22/2018 1418 04/09/18 0400  WBC 22.3* 22.5* 18.8* 19.3* 20.3* 20.6*  NEUTROABS 18.0* 18.3*  --   --   --   --   HGB 8.2* 5.5* 6.5* 8.2* 8.1* 7.7*  HCT 25.3* 17.2* 19.6* 24.3* 24.5* 23.6*  MCV 92.3 93.5 92.5 90.0 90.7 92.2  PLT 98* 99* 88* 96* 92* 66*    Basic Metabolic Panel: Recent Labs  Lab 04/21/2018 1136 04/16/2018 0600 04/09/18 0400  NA 128* 132*  132* 134*    K 4.0 4.3 3.5  CL 95* 103 106  CO2 23 21* 20*  GLUCOSE 148* 104* 103*  BUN 24* 35* 24*  CREATININE 0.92 0.82 0.84  CALCIUM 7.6* 7.3* 7.4*    GFR: Estimated Creatinine Clearance: 85.2 mL/min (by C-G formula based on SCr of 0.84 mg/dL).  Liver Function Tests: Recent Labs  Lab 04/15/2018 1136 04/03/2018 0600 04/09/18 0400  AST 56* 97* 80*  ALT 48* 49* 48*  ALKPHOS 575* 347* 404*  BILITOT 0.9 1.4* 1.2  PROT 5.4* 4.5* 5.0*  ALBUMIN 2.2* 1.9* 2.1*    Coagulation Profile: Recent Labs  Lab 03/31/2018 1811  INR 1.48     Recent Results (from the past 240 hour(s))  Blood Culture (routine x 2)     Status: None (Preliminary result)   Collection Time: 04/19/2018 12:43 PM  Result Value Ref Range Status   Specimen Description   Final    BLOOD LEFT HAND Performed at [facility], [place] [address] [place], [place] [address]    Special Requests   Final    BOTTLES DRAWN AEROBIC AND ANAEROBIC Blood Culture adequate volume Performed at [place] [address] [place], [place] [address]    Culture   Final    NO GROWTH 2 DAYS Performed at [place] [address] [place], [place] [address]    Report Status PENDING  Incomplete  Blood Culture (routine x 2)     Status: None (Preliminary result)   Collection Time: 04/15/2018  1:20 PM  Result Value Ref Range Status   Specimen Description   Final    BLOOD RIGHT PORTA CATH CHEST Performed at [facility], [address] [place], [place] [address]    Special Requests   Final    BOTTLES DRAWN AEROBIC AND ANAEROBIC Blood Culture adequate volume Performed at [place] [address] [place], [place] [address]    Culture   Final    NO GROWTH 2 DAYS Performed at [place] [address] [place], [place] [address]    Report Status PENDING  Incomplete  Urine culture     Status: None   Collection Time: 04/04/2018  2:18 PM  Result Value Ref  Range Status   Specimen Description   Final    URINE, CLEAN CATCH Performed at [facility], [place] [address], [place], [place] [address]    Special Requests   Final    NONE Performed at [facility], [place] [address] [place], [place] [address]    Culture   Final    NO GROWTH Performed at [facility], [place] [address] [place], [place] [address]    Report Status 04/29/2018 FINAL  Final  MRSA PCR Screening     Status: None   Collection Time: 04/02/2018  6:48 PM  Result Value Ref Range Status   MRSA by PCR NEGATIVE NEGATIVE Final    Comment:        The GeneXpert MRSA Assay (FDA approved for NASAL specimens only), is one component of a comprehensive MRSA colonization surveillance program. It is not intended to diagnose MRSA infection nor to guide or monitor treatment for MRSA infections. Performed at [facility], [place] [address] [place], [place] [address]       Radiology Studies: Dg Chest 2 View  Result Date: 04/11/2018 CLINICAL DATA:  Syncope with fall.  Reported esophageal carcinoma EXAM: CHEST - 2 VIEW COMPARISON:  None. FINDINGS: Port-A-Cath tip is at the cavoatrial junction. No pneumothorax. No edema or consolidation. Heart size and pulmonary vascularity are normal. No adenopathy. No acute fracture evident. Multiple metallic pellets are noted on the left. IMPRESSION: No edema or consolidation.  No adenopathy.  No mediastinal widening. Metallic pellets on the left noted. Port-A-Cath tip at cavoatrial junction. No pneumothorax. Electronically Signed   By: Lowella Grip III M.D.   On: 04/06/2018 12:11   Ct Head Wo Contrast  Result Date: 04/26/2018 CLINICAL DATA:  Syncope with fall EXAM: CT HEAD WITHOUT CONTRAST TECHNIQUE: Contiguous axial images were obtained from the base of the skull through the vertex without intravenous contrast. COMPARISON:  None. FINDINGS: Brain: There is slight diffuse atrophy. There is no  intracranial mass, hemorrhage, extra-axial fluid collection, or midline shift. The brain parenchyma appears unremarkable. There is no demonstrable acute infarct. Vascular: There is no hyperdense vessel. There is no appreciable vascular calcification. Skull: The bony calvarium appears intact. Sinuses/Orbits: There is mucosal thickening in several ethmoid air cells. Other paranasal sinuses are clear. There is rightward deviation of the nasal septum. Orbits appear symmetric bilaterally. Other: The mastoid air cells are clear. IMPRESSION: Slight diffuse atrophy. Brain parenchyma appears unremarkable. No mass or hemorrhage. There is mild mucosal thickening in several ethmoid air cells. There is deviation of the nasal septum toward the right. Electronically Signed   By: Lowella Grip III M.D.   On: 04/25/2018 12:10   Ct Abdomen Pelvis W Contrast  Result Date: 04/04/2018 CLINICAL DATA:  63 year old male with a history of esophageal cancer. Syncope EXAM: CT ABDOMEN AND PELVIS WITH CONTRAST TECHNIQUE: Multidetector CT imaging of the abdomen and pelvis was performed using the  standard protocol following bolus administration of intravenous contrast. CONTRAST:  164mL ISOVUE-300 IOPAMIDOL (ISOVUE-300) INJECTION 61% COMPARISON:  12/09/2008, 02/25/2005 FINDINGS: Lower chest: Ground-glass opacity at the periphery of the right lower lobe continuous with the diaphragm. No pleural effusion. Abnormal soft tissue at the distal esophagus near the GE junction in this patient with a given history of esophageal carcinoma. Hepatobiliary: Current CT demonstrates multiple bilateral liver masses. The largest are within the right liver, at the segment 5/6 junction measuring 5.1 cm x 6.7 cm. None of these were present on the comparison CT exam. Trace intrahepatic biliary dilatation of the left without radiopaque stones. Gallbladder relatively decompressed with no radiopaque stones. No extrahepatic biliary ductal dilatation or radiopaque  stones. Pancreas: Unremarkable pancreas Spleen: Hypodense/hypoenhancing lesion at the periphery of the spleen measuring 1.1 cm which has more uniform attenuation to spleen on the delayed images. This lesion not present on the comparison CT. Adrenals/Urinary Tract: Unremarkable adrenal glands Rounded stone within the right renal pelvis measuring 12 mm. This was not present on the comparison CT. No evidence of hydronephrosis. Mild inflammatory change/edema in the fat at the right renal pelvis. Unremarkable course of the right ureter with no additional stones. Subcentimeter hypodense cyst at the lateral cortex of the right kidney without evidence of enhancement. No evidence of left-sided hydronephrosis. Bosniak 2 cyst at the inferior left kidney. Unremarkable course of the left ureter. Urinary bladder is decompressed with circumferential wall thickening. Stomach/Bowel: Abnormal circumferential thickening of the distal esophagus with small nodularity within the adjacent fat extending through the hiatus. Balloon retention gastrostomy terminates within the stomach. Small bowel unremarkable without abnormal distention or focal thickening. No inflammatory changes. Normal appendix. Moderate stool burden without associated inflammation. No transition point or evidence of obstruction. Vascular/Lymphatic: Minimal atherosclerotic changes of the vasculature. No aneurysm. Small lymph nodes at the portacaval nodal station and at the liver hilum. No significant retroperitoneal adenopathy. Trace free fluid within the anatomic pelvis. Reproductive: Transverse diameter of the prostate estimated 19 mm Other: Small fat containing umbilical hernia Musculoskeletal: Degenerative changes of the visualized thoracolumbar spine. No significant bony canal narrowing. Multilevel degenerative disc disease degenerative changes of the bilateral hips. IMPRESSION: No CT finding to account for syncope. Large burden of hepatic metastatic disease in this  patient with known esophageal carcinoma. Irregular soft tissue thickening at the distal esophagus and GE junction, compatible with carcinoma in this patient with given history of esophageal carcinoma. Small nodularity within the adjacent fat and evidence of likely pathologic lymph nodes at the liver hilum. Hypodense lesion at the periphery of the spleen, potentially additional metastatic disease versus benign lesion such as infarct. 12 mm stone within the right renal pelvis with mild associated inflammation and no hydronephrosis. If there is concern for infection/pyelonephritis, recommend correlation with urinalysis. Appropriately positioned balloon retention percutaneous gastrostomy tube. Aortic Atherosclerosis (ICD10-I70.0). Trace free fluid in the anatomic pelvis, likely reactive. Electronically Signed   By: Corrie Mckusick D.O.   On: 04/05/2018 15:16     Medications:  Scheduled: . sodium chloride   Intravenous Once  . Chlorhexidine Gluconate Cloth  6 each Topical Daily  . free water  100 mL Per Tube Q8H  . mouth rinse  15 mL Mouth Rinse BID  . [START ON 04/11/2018] pantoprazole  40 mg Intravenous Q12H   Continuous: . sodium chloride Stopped (04/09/18 0920)  . ceFEPime (MAXIPIME) IV Stopped (04/09/18 0950)  . metronidazole Stopped (04/09/18 0746)  . pantoprozole (PROTONIX) infusion 8 mg/hr (04/09/18 1047)  . vancomycin 750  mg (04/09/18 1050)   HFW:YOVZCHYIFOYDX (TYLENOL) oral liquid 160 mg/5 mL, ondansetron **OR** ondansetron (ZOFRAN) IV, sodium chloride flush    Assessment/Plan:  Syncope Most likely secondary to orthostatic hypotension, vasovagal syndrome, severe anemia.  Patient has not had any further episodes in the hospital.  His blood pressures have improved.  Continue to monitor.  Low suspicion for venous thromboembolism.  Echo showed normal systolic function.  CT head did not show any acute intracranial findings.    Sepsis with septic shock Patient with elevated WBC, fever,  tachycardia and hypotension at admission.  Lactic acid level was elevated.  All these parameters have improved.  Blood pressure has significantly improved.  WBC is still high but better.  No obvious source of infection identified.  Cultures negative so far.  Patient was placed on broad-spectrum antibiotics with vancomycin cefepime and metronidazole.  MRSA PCR is negative.  Could discontinue vancomycin.  CT scan of the abdomen and pelvis did not show any obvious infectious source.  UA did not appear to show infection.  Chest x-ray did not show any infiltrates. He does have a Port-A-Cath which was placed back in November.  No erythema noted around that site.  ProCalcitonin was elevated at 1.84.  HIV nonreactive  GI bleeding, upper Patient mention one episode of hematemesis at home.  He has had melanotic stools in the hospital.  He had few stools overnight.  Hemoglobin lower today compared to yesterday. CT scan of the abdomen did not show any acute findings.  Gastroenterology is following.  Patient underwent EGD yesterday which showed possible site of bleeding with a clot.  No intervention was performed.  Interventional radiology consulted in case patient rebleeds then the only option would be to do embolization. He had a recent colonoscopy in November.  Patient's wife had the report.  2 polyps were removed.  Otherwise no other abnormalities were noted.  Continue PPI infusion.  Acute blood loss anemia He is status post 3 units of PRBC.  Hemoglobin had improved.  Slightly lower this morning.  We will recheck it later today.  Transfuse as needed.  Thrombocytopenia Low platelet counts most likely due to his liver metastases.  Counts noted to be lower today.  Continue to monitor.    History of esophageal cancer with liver metastases/abnormal LFTs Patient recently completed radiation treatment.  Plan was to start chemotherapy in the near future at the [place] in [place].  Followed here by radiation oncology Dr.  Lisbeth Renshaw.  Patient has a PEG tube.  Holding off on tube feedings for now.  Hyponatremia Level has improved.  Continue to monitor.  Nephrolithiasis Stone was noted in the right renal pelvis.  Some concern for inflammation on CT scan but the UA does not suggest any infection.  DVT Prophylaxis: SCDs    Code Status: Full code Family Communication: Discussed with the patient and his wife Disposition Plan: Management as outlined above.    LOS: 2 days   [place] [facility] Pager [phone] 04/09/2018, 11:50 AM  If 7PM-7AM, please contact night-coverage at [email or web address], password [facility]

## 2018-04-10 ENCOUNTER — Other Ambulatory Visit (HOSPITAL_COMMUNITY): Payer: Non-veteran care

## 2018-04-10 ENCOUNTER — Inpatient Hospital Stay (HOSPITAL_COMMUNITY): Payer: No Typology Code available for payment source

## 2018-04-10 DIAGNOSIS — I351 Nonrheumatic aortic (valve) insufficiency: Secondary | ICD-10-CM

## 2018-04-10 DIAGNOSIS — R55 Syncope and collapse: Secondary | ICD-10-CM

## 2018-04-10 DIAGNOSIS — I63512 Cerebral infarction due to unspecified occlusion or stenosis of left middle cerebral artery: Secondary | ICD-10-CM

## 2018-04-10 LAB — BLOOD GAS, ARTERIAL
Acid-base deficit: 1.2 mmol/L (ref 0.0–2.0)
Bicarbonate: 21.8 mmol/L (ref 20.0–28.0)
Drawn by: 11249
FIO2: 40
MECHVT: 530 mL
O2 Saturation: 99.5 %
PEEP: 5 cmH2O
Patient temperature: 37.5
RATE: 18 resp/min
pCO2 arterial: 31.7 mmHg — ABNORMAL LOW (ref 32.0–48.0)
pH, Arterial: 7.453 — ABNORMAL HIGH (ref 7.350–7.450)
pO2, Arterial: 164 mmHg — ABNORMAL HIGH (ref 83.0–108.0)

## 2018-04-10 LAB — COMPREHENSIVE METABOLIC PANEL
ALK PHOS: 344 U/L — AB (ref 38–126)
ALT: 49 U/L — ABNORMAL HIGH (ref 0–44)
AST: 97 U/L — ABNORMAL HIGH (ref 15–41)
Albumin: 1.8 g/dL — ABNORMAL LOW (ref 3.5–5.0)
Anion gap: 6 (ref 5–15)
BILIRUBIN TOTAL: 2.1 mg/dL — AB (ref 0.3–1.2)
BUN: 23 mg/dL (ref 8–23)
CALCIUM: 7.1 mg/dL — AB (ref 8.9–10.3)
CO2: 23 mmol/L (ref 22–32)
Chloride: 107 mmol/L (ref 98–111)
Creatinine, Ser: 1.12 mg/dL (ref 0.61–1.24)
GFR calc Af Amer: >60 mL/min (ref >60)
GFR calc non Af Amer: >60 mL/min (ref >60)
Glucose, Bld: 120 mg/dL — ABNORMAL HIGH (ref 70–99)
Potassium: 3.8 mmol/L (ref 3.5–5.1)
Sodium: 136 mmol/L (ref 135–145)
Total Protein: 4.1 g/dL — ABNORMAL LOW (ref 6.5–8.1)

## 2018-04-10 LAB — BLOOD GAS, VENOUS
Acid-base deficit: 5.8 mmol/L — ABNORMAL HIGH (ref 0.0–2.0)
Bicarbonate: 18.3 mmol/L — ABNORMAL LOW (ref 20.0–28.0)
Drawn by: 11249
FIO2: 40
O2 SAT: 53.7 %
PCO2 VEN: 33.6 mmHg — AB (ref 44.0–60.0)
PEEP: 5 cmH2O
Patient temperature: 38
RATE: 18 resp/min
VT: 530 mL
pH, Ven: 7.361 (ref 7.250–7.430)
pO2, Ven: 34.2 mmHg (ref 32.0–45.0)

## 2018-04-10 LAB — CBC
HCT: 26.2 % — ABNORMAL LOW (ref 39.0–52.0)
HEMOGLOBIN: 8.7 g/dL — AB (ref 13.0–17.0)
MCH: 31.3 pg (ref 26.0–34.0)
MCHC: 33.2 g/dL (ref 30.0–36.0)
MCV: 94.2 fL (ref 80.0–100.0)
Platelets: 27 10*3/uL — CL (ref 150–400)
RBC: 2.78 MIL/uL — ABNORMAL LOW (ref 4.22–5.81)
RDW: 14.3 % (ref 11.5–15.5)
WBC: 20.5 10*3/uL — ABNORMAL HIGH (ref 4.0–10.5)
nRBC: 0.1 % (ref 0.0–0.2)

## 2018-04-10 LAB — TRIGLYCERIDES: TRIGLYCERIDES: 105 mg/dL (ref <150)

## 2018-04-10 LAB — ECHOCARDIOGRAM LIMITED
HEIGHTINCHES: 67 in
Weight: 2532.64 oz

## 2018-04-10 LAB — PREPARE RBC (CROSSMATCH)

## 2018-04-10 LAB — TROPONIN I
Troponin I: 3.75 ng/mL (ref <0.03)
Troponin I: 8.5 ng/mL (ref <0.03)

## 2018-04-10 LAB — GLUCOSE, CAPILLARY: Glucose-Capillary: 114 mg/dL — ABNORMAL HIGH (ref 70–99)

## 2018-04-10 LAB — LACTIC ACID, PLASMA: LACTIC ACID, VENOUS: 1.8 mmol/L (ref 0.5–1.9)

## 2018-04-10 LAB — PROCALCITONIN
Procalcitonin: 6.81 ng/mL
Procalcitonin: 30.36 ng/mL

## 2018-04-10 MED ORDER — SUCRALFATE 1 GM/10ML PO SUSP: 1.0000 g | Freq: Four times a day (QID) | ORAL | Status: DC

## 2018-04-10 MED ORDER — PANTOPRAZOLE SODIUM 40 MG IV SOLR: 40.0000 mg | Freq: Two times a day (BID) | INTRAVENOUS | Status: DC

## 2018-04-10 MED ORDER — ACETAMINOPHEN 650 MG RE SUPP: 650.0000 mg | Freq: Four times a day (QID) | RECTAL | Status: DC | PRN

## 2018-04-10 MED ORDER — CHLORHEXIDINE GLUCONATE 0.12% ORAL RINSE (MEDLINE KIT)
15.0000 mL | Freq: Two times a day (BID) | OROMUCOSAL | Status: DC
  Administered 2018-04-10: 15 mL via OROMUCOSAL

## 2018-04-10 MED ORDER — METOPROLOL TARTRATE 25 MG PO TABS: 25.0000 mg | ORAL_TABLET | Freq: Two times a day (BID) | ORAL | Status: DC

## 2018-04-10 MED ORDER — ACETAMINOPHEN 325 MG PO TABS: 650.0000 mg | ORAL_TABLET | Freq: Four times a day (QID) | ORAL | Status: DC | PRN

## 2018-04-10 MED ORDER — MORPHINE SULFATE (PF) 2 MG/ML IV SOLN
1.0000 mg | INTRAVENOUS | Status: DC | PRN
  Administered 2018-04-11: 1 mg via INTRAVENOUS
  Filled 2018-04-10: qty 1

## 2018-04-10 MED ORDER — ONDANSETRON 4 MG PO TBDP: 4.0000 mg | ORAL_TABLET | Freq: Four times a day (QID) | ORAL | Status: DC | PRN

## 2018-04-10 MED ORDER — ORAL CARE MOUTH RINSE
15.0000 mL | OROMUCOSAL | Status: DC
  Administered 2018-04-10 (×3): 15 mL via OROMUCOSAL

## 2018-04-10 MED ORDER — IOPAMIDOL (ISOVUE-370) INJECTION 76%
80.0000 mL | Freq: Once | INTRAVENOUS | Status: AC | PRN
  Administered 2018-04-10: 100 mL via INTRAVENOUS

## 2018-04-10 MED ORDER — SODIUM CHLORIDE (PF) 0.9 % IJ SOLN
INTRAMUSCULAR | Status: AC
  Filled 2018-04-10: qty 100

## 2018-04-10 MED ORDER — METOPROLOL TARTRATE 25 MG PO TABS
25.0000 mg | ORAL_TABLET | Freq: Two times a day (BID) | ORAL | Status: DC
  Administered 2018-04-10: 25 mg via ORAL
  Filled 2018-04-10: qty 1

## 2018-04-10 MED ORDER — OXYCODONE HCL 5 MG PO TABS: 5.0000 mg | ORAL_TABLET | ORAL | Status: DC | PRN

## 2018-04-10 MED ORDER — SODIUM CHLORIDE 0.9 % IV SOLN
8.0000 mg/h | INTRAVENOUS | Status: DC
  Filled 2018-04-10 (×3): qty 80

## 2018-04-10 MED ORDER — IOPAMIDOL (ISOVUE-370) INJECTION 76%
50.0000 mL | Freq: Once | INTRAVENOUS | Status: AC | PRN
  Administered 2018-04-10: 50 mL via INTRAVENOUS

## 2018-04-10 MED ORDER — GLYCOPYRROLATE 0.2 MG/ML IJ SOLN: 0.2000 mg | INTRAMUSCULAR | Status: DC | PRN

## 2018-04-10 MED ORDER — GLYCOPYRROLATE 1 MG PO TABS
1.0000 mg | ORAL_TABLET | ORAL | Status: DC | PRN
  Filled 2018-04-10: qty 1

## 2018-04-10 MED ORDER — FENTANYL CITRATE (PF) 100 MCG/2ML IJ SOLN
25.0000 ug | INTRAMUSCULAR | Status: DC | PRN
  Administered 2018-04-11: 25 ug via INTRAVENOUS
  Filled 2018-04-10: qty 2

## 2018-04-10 MED ORDER — IOPAMIDOL (ISOVUE-370) INJECTION 76%
INTRAVENOUS | Status: AC
  Filled 2018-04-10: qty 100

## 2018-04-10 MED ORDER — ONDANSETRON HCL 4 MG/2ML IJ SOLN: 4.0000 mg | Freq: Four times a day (QID) | INTRAMUSCULAR | Status: DC | PRN

## 2018-04-10 MED ORDER — GLYCOPYRROLATE 0.2 MG/ML IJ SOLN
0.2000 mg | INTRAMUSCULAR | Status: DC | PRN
  Administered 2018-04-12: 0.2 mg via INTRAVENOUS
  Filled 2018-04-10: qty 1

## 2018-04-10 MED ORDER — BIOTENE DRY MOUTH MT LIQD: 15.0000 mL | OROMUCOSAL | Status: DC | PRN

## 2018-04-10 MED ORDER — POLYVINYL ALCOHOL 1.4 % OP SOLN
1.0000 [drp] | Freq: Four times a day (QID) | OPHTHALMIC | Status: DC | PRN
  Filled 2018-04-10: qty 15

## 2018-04-10 MED ORDER — HYDRALAZINE HCL 20 MG/ML IJ SOLN
10.0000 mg | INTRAMUSCULAR | Status: DC | PRN
  Administered 2018-04-10: 10 mg via INTRAVENOUS
  Filled 2018-04-10: qty 1

## 2018-04-10 MED ORDER — AMLODIPINE BESYLATE 10 MG PO TABS
10.0000 mg | ORAL_TABLET | Freq: Every day | ORAL | Status: DC
  Administered 2018-04-10: 10 mg
  Filled 2018-04-10: qty 1

## 2018-04-10 MED FILL — Medication: Qty: 1 | Status: AC

## 2018-04-10 NOTE — Procedures (Signed)
Extubation Procedure Note  Patient Details:   Name: Drew Vega DOB: [birth year] MRN: [id number]   Airway Documentation:    Vent end date: 04/10/18 Vent end time: 1125   Evaluation  O2 sats: stable throughout Complications: No apparent complications Patient did tolerate procedure well. Bilateral Breath Sounds: Diminished, Rhonchi   Yes  Tamera Reason 04/10/2018, 11:26 AM

## 2018-04-10 NOTE — Consult Note (Signed)
[facility] TeleNeurology Consult Services   Date of Service:   04/10/2018 18:28:09  Impression:     .  Right Hemispheric Infarct     .  MCA Distribution Infarct     .  L PCA Distribution Infarct  Comments: 63yo M admitted for UGI bleed and esophageal cancer with liver metastasis last known well at 17:15 later found w L HP, R gaze deviation and slurred speech. Not candidate for NIR due to bleeding. Discussed case w neurologist. Pt to be transferred to [facility] for [place] for further evaluation to see if candidate for NIR. D/w case w Neuro/NIR at [facility] and [facility]ad. If no intervention, will need routine neurology consult as an inpt.  Mechanism of Stroke: Possible Thromboembolic Possible Cardioembolic Small Vessel Disease Watershed Infarct Not Clear  Metrics: Last Known Well: 04/10/2018 17:15:00 [facility] Notification Time: 04/10/2018 18:28:09 Stamp Time: 04/10/2018 18:28:09 Time First Login Attempt: 04/10/2018 18:34:05 Video Start Time: 04/10/2018 18:34:05  Symptoms: L hemiparesis NIHSS Start Assessment Time: 04/10/2018 18:48:00 Patient is not a candidate for tPA. Patient was not deemed candidate for tPA thrombolytics because of GI Malignancy or GI Bleeding (Within 21 Days). Video End Time: 04/10/2018 18:56:00  CT head was reviewed and results were: acute loss of gray white R mca territory  Advanced imaging was reviewed. Advanced imaging CTA head and neck obtained.   Radiologist was called back for review of advanced imaging on 04/10/2018 19:03:58 Discussed with Neurointerventionalist on 04/10/2018 19:16:17  Our recommendations are outlined below.  Recommendations:     .  Activate Stroke Protocol Admission/Order Set     .  Stroke/Telemetry Floor     .  Neuro Checks     .  Bedside Swallow Eval     .  DVT Prophylaxis     .  IV Fluids, Normal Saline     .  Head of Bed Below 30 Degrees     .  Euglycemia and Avoid Hyperthermia (PRN Acetaminophen)     .   Hold Antithrombotics for Now   Sign Out:     .  Discussed with Primary Attending     .  Discussed with Rapid Response Team    ------------------------------------------------------------------------------  History of Present Illness: Patient is a 63 year old Male.  Inpatient stroke alert was called for symptoms of L hemiparesis  63yo M admitted for UGI bleed and esophageal cancer with liver metastasis last known well at 17:15 later found w L HP, R gaze deviation and slurred speech.  CT head was reviewed.  Last seen normal was within 4.5 hours. There is history of hemorrhagic complications or intracranial hemorrhage. There is no history of Recent Anticoagulants.  Examination: 1A: Level of Consciousness - Arouses to minor stimulation + 1 1B: Ask Month and Age - 1 Question Right + 1 1C: Blink Eyes & Squeeze Hands - Performs Both Tasks + 0 2: Test Horizontal Extraocular Movements - Forced Gaze Palsy: Cannot Be Overcome + 2 3: Test Visual Fields - Bilateral Hemianopia + 3 4: Test Facial Palsy (Use Grimace if Obtunded) - Partial paralysis (lower face) + 2 5A: Test Left Arm Motor Drift - No Movement + 4 5B: Test Right Arm Motor Drift - No Drift for 10 Seconds + 0 6A: Test Left Leg Motor Drift - No Movement + 4 6B: Test Right Leg Motor Drift - Some Effort Against Gravity + 2 7: Test Limb Ataxia (FNF/Heel-Shin) - No Ataxia + 0 8: Test Sensation - Complete Loss: Cannot Sense  Being Touched At All + 2 9: Test Language/Aphasia - Normal; No aphasia + 0 10: Test Dysarthria - Mild-Moderate Dysarthria: Slurring but can be understood + 1 11: Test Extinction/Inattention - Profound hemi-inattention (ex: does not recognize own hand) + 2  NIHSS Score: 24  Patient was informed the Neurology Consult would happen via TeleHealth consult by way of interactive audio and video telecommunications and consented to receiving care in this manner.  Due to the immediate potential for life-threatening  deterioration due to underlying acute neurologic illness, I spent 35 minutes providing critical care. This time includes time for face to face visit via telemedicine, review of medical records, imaging studies and discussion of findings with providers, the patient and/or family.   Dr Barron Schmid   [facility] [phone]   Case [id number]

## 2018-04-10 NOTE — Procedures (Signed)
Arterial Catheter Insertion Procedure Note Drew Vega [id number] 04-Sep-1955  Procedure: Insertion of Arterial Catheter  Indications: Blood pressure monitoring and Frequent blood sampling  Procedure Details Consent: Risks of procedure as well as the alternatives and risks of each were explained to the (patient/caregiver).  Consent for procedure obtained. Time Out: Verified patient identification, verified procedure, site/side was marked, verified correct patient position, special equipment/implants available, medications/allergies/relevent history reviewed, required imaging and test results available.  Performed  Maximum sterile technique was used including antiseptics, cap, gloves, gown, hand hygiene, mask and sheet. Skin prep: Iodine solution; local anesthetic administered 22 gauge catheter was inserted into right radial artery using the Seldinger technique. ULTRASOUND GUIDANCE USED: NO Evaluation Blood flow good; BP tracing good. Complications: No apparent complications.   Leta [facility] 04/10/2018

## 2018-04-10 NOTE — Progress Notes (Signed)
I went back by the room to check on the patient, and discussed with the wife and brother.  They were asking about giving him sips of water, and I indicated that he was likely aspirate if we did this.  I stated that the only way that we could pursue anything by mouth as if he was comfort only care, then we could do comfort sips.  After discussion with the wife and the brother, they indicate that they would favor pursuing comfort only care.  He will likely need hospice, though there is a chance that given the size of his infarct that he may have significant cerebral edema and progressed to herniation, though this is not definite.  Given the desire to change comfort care, I have discontinued his other medications, and I have placed the end-of-life order set.  Roland Rack, MD [facility] [phone]  If 7pm- 7am, please page neurology on call as listed in [place].

## 2018-04-10 NOTE — Progress Notes (Signed)
NAME:  Drew Vega MRN:  [id number] DOB:  November 13, 1955 LOS: 3 ADMISSION DATE:  04/16/2018  CONSULTATION DATE:  04/09/2018 REFERRING MD:  Dr. Bonnielee Haff  REASON FOR CONSULTATION:  Ventricular fibrillation   Initial Pulmonary/Critical Care Consultation  Brief History   This 63 y.o. Caucasian male nonsmoker (retired Building control surveyor) with recently diagnosed metastatic esophageal cancer presented to the [facility] on 04/11/2018 with complaints of syncope and hematemesis. He was initially admitted for further evaluation and management of GI bleed and acute blood loss anemia.  On 1/12 patient complained of burning epigastric pain and suddenly became unresponsive. CODE BLUE was called, and he was found to be in ventricular fibrillation. ROSC was achieved in about 2 minutes (chest compressions, cardioversion, 1 amp bicarbonate). The patient was intubated per ACLS protocol, and the patient has transferred to ICU for further management.   Past Medical History  Hypertension, adenocarcinoma of esophagus  Significant Hospital Events   1/9: admitted for syncope, hematemesis. Found to be anemic and hypotensive. 1/10: EGD showed stigmata of esophageal ulcer bleeding (clot). 1/11: CODE BLUE after epigastric pain. V-fib. ROSC in 2 min. Intubated. Tx to ICU.  Consults:  GI Radiology (visceral arteriogram and embolization)  Procedures:  02/2018: PEG placement 1/10: EGD 1/10: visceral arteriogram and embolization  Significant Diagnostic Tests:  1/10: 2D echo LVEF 65-70%.  Micro Data:    Antimicrobials:  Cefepime/Flagyl (1/11>>   Interim history/subjective:  Hemodynamically stable, awake on the ventilator, neurologically intact  Objective   BP 111/73   Pulse 78   Temp 97.7 F (36.5 C) (Core)   Resp 11   Ht 5\' 7"  (1.702 m)   Wt 71.8 kg   SpO2 100%   BMI 24.79 kg/m  CVP:  [0 mmHg-25 mmHg] 23 mmHg  Filed Weights   04/29/2018 1738 04/28/2018 1100  Weight: 71.8 kg 71.8 kg     Intake/Output Summary (Last 24 hours) at 04/10/2018 0720 Last data filed at 04/10/2018 0300 Gross per 24 hour  Intake 3848.89 ml  Output 150 ml  Net 3698.89 ml    Vent Mode: PRVC FiO2 (%):  [40 %-100 %] 40 % Set Rate:  [16 bmp-18 bmp] 18 bmp Vt Set:  [530 mL] 530 mL PEEP:  [5 cmH20] 5 cmH20 Plateau Pressure:  [14 cmH20-15 cmH20] 15 cmH20   Examination: Gen:      No acute distress HEENT:  EOMI, sclera anicteric, ET tube Neck:     No masses; no thyromegaly Lungs:    Clear to auscultation bilaterally; normal respiratory effort CV:         Regular rate and rhythm; no murmurs Abd:      + bowel sounds; soft, non-tender; no palpable masses, no distension Ext:    No edema; adequate peripheral perfusion Skin:      Warm and dry; no rash Neuro: Awake, follows simple commands.  Resolved Hospital Problem list   N/A   Assessment & Plan:  V. fib arrest, atrial fibrillation, elevated troponin.  ?NSTEMI Unable to anticoagulate due to recent GI bleed. Trend troponin Telemetry monitoring, rate control Cardiology consult  Respiratory failure, intubated in the setting of cardiac arrest Continue vent support Start weans  Septic shock Continue broad antibiotic coverage Follow cultures, procalcitonin  GI bleed from esophageal ulcer. S/p EGD Follow CBC.  Will need IR embolization if he continues to bleed.  Best practice:  Diet: NPO Pain/Anxiety/Delirium protocol (if indicated): propofol/fentanyl VAP protocol (if indicated): YES DVT prophylaxis: SCDs GI prophylaxis: Protonix, sucralfatre Glucose  control: not indicated Mobility/Activity: bed rest CODE STATUS: Full code Family Communication:  Wife updated at bedside Disposition: ICU  Labs   CBC: Recent Labs  Lab 04/19/2018 1136 04/28/2018 1628  04/07/2018 1418 04/09/18 0400 04/09/18 1200 04/09/18 1951 04/10/18 0432  WBC 22.3* 22.5*   < > 20.3* 20.6* 21.3* 35.2* 20.5*  NEUTROABS 18.0* 18.3*  --   --   --   --  26.6*  --    HGB 8.2* 5.5*   < > 8.1* 7.7* 7.2* 9.2* 8.7*  HCT 25.3* 17.2*   < > 24.5* 23.6* 22.4* 29.7* 26.2*  MCV 92.3 93.5   < > 90.7 92.2 94.9 96.7 94.2  PLT 98* 99*   < > 92* 66* 56* 69* 27*   < > = values in this interval not displayed.    Basic Metabolic Panel: Recent Labs  Lab 04/21/2018 1136 04/09/2018 0600 04/09/18 0400 04/09/18 1951 04/10/18 0432  NA 128* 132*  132* 134* 138 136  K 4.0 4.3 3.5 4.5 3.8  CL 95* 103 106 107 107  CO2 23 21* 20* 18* 23  GLUCOSE 148* 104* 103* 152* 120*  BUN 24* 35* 24* 20 23  CREATININE 0.92 0.82 0.84 1.03 1.12  CALCIUM 7.6* 7.3* 7.4* 7.8* 7.1*  MG  --   --   --  2.3  --   PHOS  --   --   --  4.0  --    GFR: Estimated Creatinine Clearance: 63.9 mL/min (by C-G formula based on SCr of 1.12 mg/dL). Recent Labs  Lab 04/03/2018 1811  04/20/2018 2046  04/09/18 0400 04/09/18 1200 04/09/18 1951 04/09/18 2318 04/10/18 0432  PROCALCITON 1.84  --   --   --   --   --   --  6.81 30.36  WBC  --    < >  --    < > 20.6* 21.3* 35.2*  --  20.5*  LATICACIDVEN 3.2*  --  1.7  --   --   --  7.0*  --  1.8   < > = values in this interval not displayed.    Liver Function Tests: Recent Labs  Lab 04/11/2018 1136 04/24/2018 0600 04/09/18 0400 04/09/18 1951 04/10/18 0432  AST 56* 97* 80* 96* 97*  ALT 48* 49* 48* 52* 49*  ALKPHOS 575* 347* 404* 446* 344*  BILITOT 0.9 1.4* 1.2 1.3* 2.1*  PROT 5.4* 4.5* 5.0* 5.3* 4.1*  ALBUMIN 2.2* 1.9* 2.1* 2.1* 1.8*   No results for input(s): LIPASE, AMYLASE in the last 168 hours. No results for input(s): AMMONIA in the last 168 hours.  ABG    Component Value Date/Time   PHART 7.453 (H) 04/09/2018 2300   PCO2ART 31.7 (L) 04/09/2018 2300   PO2ART 164 (H) 04/09/2018 2300   HCO3 21.8 04/09/2018 2300   ACIDBASEDEF 1.2 04/09/2018 2300   O2SAT 99.5 04/09/2018 2300     Coagulation Profile: Recent Labs  Lab 04/29/2018 1811  INR 1.48    Cardiac Enzymes: Recent Labs  Lab 04/09/18 1951 04/10/18 0432  TROPONINI 0.92* 3.75*     HbA1C: No results found for: HGBA1C  CBG: Recent Labs  Lab 04/09/18 1922  GLUCAP 124*   The patient is critically ill with multiple organ system failure and requires high complexity decision making for assessment and support, frequent evaluation and titration of therapies, advanced monitoring, review of radiographic studies and interpretation of complex data.   Critical Care Time devoted to patient care services, exclusive of separately  billable procedures, described in this note is 35 minutes.   Marshell Garfinkel MD [facility] Pager [phone] If no answer call [phone] 04/10/2018, 7:51 AM

## 2018-04-10 NOTE — Progress Notes (Signed)
Pharmacy Antibiotic Note  Drew Vega is a 63 y.o. male admitted on 04/19/2018 with sepsis.  Pharmacy has been consulted for vancomycin and cefepime dosing.  Today, 04/10/18  Day 3 Abx's  WBC improved from yesterday but still elevated  SCr stable  Procalc increased  Cultures ngtd  Plan: 1) Continue current cefepime dosing 2) Continue Flagyl per Md  Height: 5\' 7"  (170.2 cm) Weight: 158 lb 4.6 oz (71.8 kg) IBW/kg (Calculated) : 66.1  Temp (24hrs), Avg:98.8 F (37.1 C), Min:97.7 F (36.5 C), Max:100.4 F (38 C)  Recent Labs  Lab 04/15/2018 1136  04/09/2018 1252  04/06/2018 1811  04/15/2018 2046 04/11/2018 0600 04/18/2018 1418 04/09/18 0400 04/09/18 1200 04/09/18 1951 04/10/18 0432  WBC 22.3*  --   --    < >  --    < >  --  19.3* 20.3* 20.6* 21.3* 35.2* 20.5*  CREATININE 0.92  --   --   --   --   --   --  0.82  --  0.84  --  1.03 1.12  LATICACIDVEN  --    < > 2.84*  --  3.2*  --  1.7  --   --   --   --  7.0* 1.8   < > = values in this interval not displayed.    Estimated Creatinine Clearance: 63.9 mL/min (by C-G formula based on SCr of 1.12 mg/dL).    No Known Allergies  Antimicrobials this admission: 1/9 Cefepime >>  1/9 Vanc >>   Dose adjustments this admission: n/a  Microbiology results: 1/9 BCx: ngtd 1/9 UCx: ngf  1/9 Flu PCR: neg/neg 1/9 MRSA PCR: neg   Thank you for allowing pharmacy to be a part of this patient's care.  Adrian Saran, PharmD, BCPS 04/10/2018 12:20 PM

## 2018-04-10 NOTE — Progress Notes (Addendum)
   Patient Name: Drew Vega Date of Encounter: 04/10/2018, 1:14 PM    Subjective  V fib arrest overnight - intubated and extubated    Objective  BP 111/73   Pulse 83   Temp 97.9 F (36.6 C)   Resp (!) 0   Ht 5\' 7"  (1.702 m)   Wt 71.8 kg   SpO2 100%   BMI 24.79 kg/m   CBC Latest Ref Rng & Units 04/10/2018 04/09/2018 04/09/2018  WBC 4.0 - 10.5 K/uL 20.5(H) 35.2(H) 21.3(H)  Hemoglobin 13.0 - 17.0 g/dL 8.7(L) 9.2(L) 7.2(L)  Hematocrit 39.0 - 52.0 % 26.2(L) 29.7(L) 22.4(L)  Platelets 150 - 400 K/uL 27(LL) 69(L) 56(L)      Assessment and Plan  GI bleed from esophageal ulcer s/p XRT for adenocarcinoma Liver mets V fib arrest  Hgb stable after blood yesterday -   No major bleeding  Continue to observe, PPI infusion, carafate Tx , IR intervention would be next step if bleeds significantly  Unfortunately a bad situation has worsened. It will be some time (if ever) before he can tolerate any anticoagulation/anti-PLT tx (should it be needed) I wonder if XRT, cancer have had anything to do with his cardiac event?    Gatha Mayer, MD, [facility] 04/10/2018 1:14 PM Pager [phone]

## 2018-04-10 NOTE — Progress Notes (Signed)
  Echocardiogram 2D Echocardiogram has been performed.  Drew Vega G Joselyn Edling 04/10/2018, 9:10 AM

## 2018-04-10 NOTE — Progress Notes (Signed)
Pt. Went into pulseless Vtach 05/03/2022 day and night nurses coded him at [place] he was shocked once got 1 amp of bicarb. ROSC at [place]. He was intubated at 1927. RN then gave 10mg  bolus of cardizem and continued the drip at 5mg . Propofol was started. It was then turned off at 2130 for hypotension of 90's/40's. Pt. Was given 1L of LR for BP and 1 amp of bicarb. Central line was placed at 2130 and ART line was placed at 2157. Pt. Then received 1 unit of blood at 2309. PT. Vitals have been stable the remainder of the night and pt. Will awake to verbal stimulus and follow commands.

## 2018-04-10 NOTE — Progress Notes (Signed)
RN notified by patients brother at 13 that the patient is "slurring when he talks."  RN assessed patient and patient found to be verbally slurring his words with significant delay.  Smile asymetrical with droop to the left.  Pt. Completely flaccid on left side of body.  Pt. Unable to track nurse on the left.  RN placed order for STAT for Head CT for suspected stroke at 1746.  Code stroke called.  Patient taken to CT and tele neuro initiated after CT of head completed.  RN updated Dr. Vaughan Browner on patients condition, RN received an order for a Head CT Angio and Neck this order was immediately carried out.  Pt. Transported back to ICU.  Order for transfer to [facility] ICU placed.  Receiving rapid response nurse contacted and report given arrangements made for Rapid response to meet patient upon arrival and take straight to CT.  Report given to carelink and carelink in route.

## 2018-04-10 NOTE — Progress Notes (Signed)
Notified David at [facility] rapid response that patient is en route to [facility].

## 2018-04-10 NOTE — Consult Note (Signed)
Neurology Consultation Reason for Consult: Acute stroke Referring Physician: Levada Schilling  CC: Left-sided weakness  History is obtained from: Chart review, family  HPI: Drew Vega is a 63 y.o. male who was in his normal state of health until 5:15 PM at which point he was noted to began slurring his words and also had problems with his left side.  A code stroke was activated and he was seen by tele-neurology, but was unfortunately not an IV TPA candidate due to his recent GI bleeding.  Due to the findings concerning for LVO he was taken for stat CTA which demonstrated a right M1 occlusion.  Given his other comorbidities, even though he was within the timeframe for IR, he was taken for repeat scanning on arrival to [facility] which demonstrated an aspect decay from 9->2 as well as CT perfusion which demonstrated 166 cc of core infarct.  I discussed these findings with the family who expressed desire to make him DNR and I will further discuss comfort care with family.   LKW: 5:15 PM tpa given?: no, recent GI hemorrhage  ROS: A 14 point ROS was performed and is negative except as noted in the HPI.   Past Medical History:  Diagnosis Date  . Adenocarcinoma of esophagus, stage 4 (HCC)   . Hypertension      Family History  Problem Relation Age of Onset  . Throat cancer Mother      Social History:  reports that he has never smoked. He has never used smokeless tobacco. He reports current alcohol use. No history on file for drug.   Exam: Current vital signs: BP 111/73   Pulse (!) 115   Temp (!) 101.3 F (38.5 C)   Resp (!) 33   Ht 5\' 7"  (1.702 m)   Wt 71.8 kg   SpO2 97%   BMI 24.79 kg/m  Vital signs in last 24 hours: Temp:  [97.7 F (36.5 C)-101.3 F (38.5 C)] 101.3 F (38.5 C) (01/12 1700) Pulse Rate:  [73-166] 115 (01/12 1700) Resp:  [0-37] 33 (01/12 1700) BP: (93-127)/(59-79) 111/73 (01/12 0400) SpO2:  [94 %-100 %] 97 % (01/12 1700) FiO2 (%):  [40 %] 40 % (01/12  0909)   Physical Exam  Constitutional: Appears well-developed and well-nourished.  Psych: Affect appropriate to situation Eyes: No scleral injection HENT: No OP obstrucion Head: Normocephalic.  Cardiovascular: Normal rate and regular rhythm.  Respiratory: Effort normal, non-labored breathing GI: Soft.  No distension. There is no tenderness.  Skin: WDI  Neuro: Mental Status: Patient is awake, alert, he has a severe left hemineglect, he also has significant confusion. Cranial Nerves: II: He has a dense right hemianopia, he is able to identify some finger wiggling on the left but not reliably count fingers pupils are equal, round, and reactive to light.   III,IV, VI: Right gaze preference V: VII: Facial movement is diminished on the left XII: tongue deviates to the left Motor: He has a dense left hemiparesis with no movement of the arm or leg Sensory: Sensation is greatly diminished on the left Cerebellar: Does not perform  I have reviewed labs in epic and the results pertinent to this consultation are: CMP-elevated AST, ALT  I have reviewed the images obtained: CTP- massive right hemispheric infarct, pseudonormalization of the left PCA  Impression: 63 year old male with terrible comorbidities with poor prognosis prior to his massive hemispheric infarct who now has a dismal prognosis.  I have discussed this with the family who appear understanding, and  I would favor changing the patient to comfort only care.  I suspect that he will have significant edema over the next few days.  Recommendations: 1) recommend only comfort care measures. 2) I have made the patient DNR after conversation with wife.   This patient is critically ill and at significant risk of neurological worsening, death and care requires constant monitoring of vital signs, hemodynamics,respiratory and cardiac monitoring, neurological assessment, discussion with family, other specialists and medical decision  making of high complexity. I spent 90 minutes of neurocritical care time  in the care of  this patient. This was time spent independent of any time provided by nurse practitioner or PA.  Roland Rack, MD [facility] [phone]  If 7pm- 7am, please page neurology on call as listed in [place]. 04/10/2018  9:11 PM

## 2018-04-10 NOTE — Progress Notes (Signed)
CRITICAL VALUE ALERT  Critical Value:  Troponin 3.75 Platelets 27  Date & Time Notied:  01/12 0630  Provider Notified: MD aware.  Orders Received/Actions taken: No further orders currently.

## 2018-04-10 NOTE — Progress Notes (Signed)
PT. Labs drawn at [place] [facility] on 1/11 were drawn after a code, but blood had just finished 20 minutes before. Labs may not be accurate. MD and lab made aware.

## 2018-04-10 NOTE — Consult Note (Addendum)
Cardiology Consultation:   Patient ID: Drew Vega MRN: [id number]; DOB: September 04, 1955  Admit date: 04/14/2018 Date of Consult: 04/10/2018  Primary Care Provider: [facility], [facility] Primary Cardiologist: Skeet Latch, MD (new)   Patient Profile:   Drew Vega is a 63 y.o. male with a hx of metastatic esophageal cancer admitted with GI bleed who is being seen today for the evaluation of ventricular fibrillation at the request of Dr. Vaughan Browner.  History of Present Illness:   Drew Vega was admitted 1/9 for an episode of syncope and hematemesis.  He was hypotensive and anemic with initial hemoglobin of 8.2, which decreased to 5.5.  Initial lactate was 2.87.  He underwent EGD on 04/16/2018 which revealed an esophageal ulcer which had a large clot but no active bleeding.  Echo revealed LVEF 65 to 70% with normal diastolic function.  On 1/11 he had an episode of VF.  He was intubated and received received one shock and approximately 2 minutes of CPR.  Troponin has risen to 3.75.  Potassium was 4.5, magnesium 2.3, and lactate 7. Hgb 9.2.  His wife notes that he has no cardiac history and hadn't complained of chest pain or palpitations.  He had no lower extremity edema, orthopnea or PND.   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  He has metastases to the liver and lymph nodes.  He has a g-tube and completed XRT on 04/06/18.    Past Medical History:  Diagnosis Date  . Adenocarcinoma of esophagus, stage 4 (HCC)   . Hypertension     Past Surgical History:  Procedure Laterality Date  . ARTERIAL LINE INSERTION  04/09/2018      . ESOPHAGOGASTRODUODENOSCOPY N/A 04/28/2018   Procedure: ESOPHAGOGASTRODUODENOSCOPY (EGD);  Surgeon: Gatha Mayer, MD;  Location: [facility];  Service: Endoscopy;  Laterality: N/A;  . PEG PLACEMENT  02/2018  . PROSTATECTOMY  2018     Home Medications:  Prior to Admission medications   Medication Sig Start Date End Date  Taking? Authorizing Provider  amLODipine (NORVASC) 10 MG tablet Take 10 mg by mouth daily.   Yes [provider]  aspirin EC 81 MG tablet Take 81 mg by mouth daily.   Yes [provider]  atenolol (TENORMIN) 100 MG tablet Take 100 mg by mouth daily.   Yes [provider]  chlorthalidone (HYGROTON) 25 MG tablet Take 12.5 mg by mouth daily. Take one half tablet daily.    Yes [provider]  lisinopril (PRINIVIL,ZESTRIL) 40 MG tablet Take 20 mg by mouth daily.    Yes [provider]  Omega-3 Fatty Acids (FISH OIL) 1000 MG CAPS Take 1,000 mg by mouth daily.   Yes [provider]  omeprazole (PRILOSEC) 20 MG capsule Take 20 mg by mouth daily.   Yes [provider]  sildenafil (VIAGRA) 100 MG tablet Take 100 mg by mouth daily as needed for erectile dysfunction.   Yes [provider]    Inpatient Medications: Scheduled Meds: . sodium chloride   Intravenous Once  . sodium chloride   Intravenous Once  . chlorhexidine gluconate (MEDLINE KIT)  15 mL Mouth Rinse BID  . Chlorhexidine Gluconate Cloth  6 each Topical Daily  . free water  100 mL Per Tube Q8H  . mouth rinse  15 mL Mouth Rinse 10 times per day  . metoprolol tartrate  5 mg Intravenous Q6H  . sucralfate  1 g Oral Q6H   Continuous Infusions: . ceFEPime (MAXIPIME) IV Stopped (  04/10/18 0145)  . diltiazem (CARDIZEM) infusion 5 mg/hr (04/09/18 2031)  . metronidazole Stopped (04/10/18 4287)  . pantoprozole (PROTONIX) infusion 8 mg/hr (04/10/18 6811)  . propofol (DIPRIVAN) infusion 35 mcg/kg/min (04/10/18 0300)   PRN Meds: acetaminophen (TYLENOL) oral liquid 160 mg/5 mL, fentaNYL (SUBLIMAZE) injection, fentaNYL (SUBLIMAZE) injection, ondansetron **OR** ondansetron (ZOFRAN) IV, sodium chloride flush  Allergies:   No Known Allergies  Social History:   Social History   Socioeconomic History  . Marital status: Single    Spouse name: Not on file  . Number of children:  Not on file  . Years of education: Not on file  . Highest education level: Not on file  Occupational History  . Not on file  Social Needs  . Financial resource strain: Not on file  . Food insecurity:    Worry: Not on file    Inability: Not on file  . Transportation needs:    Medical: No    Non-medical: No  Tobacco Use  . Smoking status: Never Smoker  . Smokeless tobacco: Never Used  Substance and Sexual Activity  . Alcohol use: Yes    Comment: occasional  . Drug use: Not on file  . Sexual activity: Not on file  Lifestyle  . Physical activity:    Days per week: Not on file    Minutes per session: Not on file  . Stress: Not on file  Relationships  . Social connections:    Talks on phone: Not on file    Gets together: Not on file    Attends religious service: Not on file    Active member of club or organization: Not on file    Attends meetings of clubs or organizations: Not on file    Relationship status: Not on file  . Intimate partner violence:    Fear of current or ex partner: Not on file    Emotionally abused: Not on file    Physically abused: Not on file    Forced sexual activity: Not on file  Other Topics Concern  . Not on file  Social History Narrative  . Not on file    Family History:    Family History  Problem Relation Age of Onset  . Throat cancer Mother      ROS:  Intubated and sedated.  Unable to assess.   Physical Exam/Data:   Vitals:   04/10/18 0200 04/10/18 0214 04/10/18 0300 04/10/18 0400  BP: 106/72  105/68 111/73  Pulse: 83 83 81 78  Resp: 18 10 (!) 0 11  Temp: 98.2 F (36.8 C) 98.1 F (36.7 C) 97.9 F (36.6 C) 97.7 F (36.5 C)  TempSrc:  Core  Core  SpO2: 100% 100% 100% 100%  Weight:      Height:        Intake/Output Summary (Last 24 hours) at 04/10/2018 0909 Last data filed at 04/10/2018 0300 Gross per 24 hour  Intake 3219.16 ml  Output 150 ml  Net 3069.16 ml   Last 3 Weights 04/06/2018 04/15/2018 03/15/2018  Weight (lbs) 158  lb 4.6 oz 158 lb 4.6 oz 157 lb  Weight (kg) 71.8 kg 71.8 kg 71.215 kg     VS:  BP 111/73   Pulse 83   Temp 97.9 F (36.6 C)   Resp (!) 0   Ht 5' 7" (1.702 m)   Wt 71.8 kg   SpO2 100%   BMI 24.79 kg/m  , BMI Body mass index is 24.79 kg/m. GENERAL:  Critically ill-appearing.  Intubated and sedated. HEENT: Pupils equal round and reactive, fundi not visualized, oral mucosa unremarkable NECK:  No jugular venous distention, waveform within normal limits, carotid upstroke brisk and symmetric, no bruits LUNGS:  Clear to auscultation bilaterally HEART:  RRR.  PMI not displaced or sustained,S1 and S2 within normal limits, no S3, no S4, no clicks, no rubs, no murmurs ABD:  Flat, positive bowel sounds normal in frequency in pitch, no bruits, no rebound, no guarding, no midline pulsatile mass, no hepatomegaly, no splenomegaly EXT:  2 plus pulses throughout, 1+ edema, no cyanosis no clubbing SKIN:  No rashes no nodules NEURO:  Cranial nerves II through XII grossly intact, motor grossly intact throughout PSYCH:  Cognitively intact, oriented to person place and time   EKG:  The EKG was personally reviewed and demonstrates:  Sinus rhythm.  Rate 83 bpm.  Low voltage.  Telemetry:  Telemetry was personally reviewed and demonstrates:  Ventricular fibrillation on 04/09/18.  22 Beats NSVT 02/09/19.  Relevant CV Studies:  Echo 04/17/2018: Study Conclusions  - Left ventricle: The cavity size was normal. Systolic function was   vigorous. The estimated ejection fraction was in the range of 65%   to 70%. Wall motion was normal; there were no regional wall   motion abnormalities. Left ventricular diastolic function   parameters were normal. - Aortic valve: Valve area (VTI): 2.87 cm^2. Valve area (Vmax): 2.7   cm^2. Valve area (Vmean): 2.38 cm^2.  Laboratory Data:  Chemistry Recent Labs  Lab 04/09/18 0400 04/09/18 1951 04/10/18 0432  NA 134* 138 136  K 3.5 4.5 3.8  CL 106 107 107  CO2 20* 18* 23   GLUCOSE 103* 152* 120*  BUN 24* 20 23  CREATININE 0.84 1.03 1.12  CALCIUM 7.4* 7.8* 7.1*  GFRNONAA >60 >60 >60  GFRAA >60 >60 >60  ANIONGAP _0 Recent Labs  Lab 04/09/18 0400 04/09/18 1951 04/10/18 0432  PROT 5.0* 5.3* 4.1*  ALBUMIN 2.1* 2.1* 1.8*  AST 80* 96* 97*  ALT 48* 52* 49*  ALKPHOS 404* 446* 344*  BILITOT 1.2 1.3* 2.1*   Hematology Recent Labs  Lab 04/09/18 1200 04/09/18 1951 04/10/18 0432  WBC 21.3* 35.2* 20.5*  RBC 2.36* 3.07* 2.78*  HGB 7.2* 9.2* 8.7*  HCT 22.4* 29.7* 26.2*  MCV 94.9 96.7 94.2  MCH 30.5 30.0 31.3  MCHC 32.1 31.0 33.2  RDW 14.5 14.4 14.3  PLT 56* 69* 27*   Cardiac Enzymes Recent Labs  Lab 04/09/18 1951 04/10/18 0432  TROPONINI 0.92* 3.75*   No results for input(s): TROPIPOC in the last 168 hours.  BNPNo results for input(s): BNP, PROBNP in the last 168 hours.  DDimer No results for input(s): DDIMER in the last 168 hours.  Radiology/Studies:  Dg Chest 2 View  Result Date: 04/02/2018 CLINICAL DATA:  Syncope with fall.  Reported esophageal carcinoma EXAM: CHEST - 2 VIEW COMPARISON:  None. FINDINGS: Port-A-Cath tip is at the cavoatrial junction. No pneumothorax. No edema or consolidation. Heart size and pulmonary vascularity are normal. No adenopathy. No acute fracture evident. Multiple metallic pellets are noted on the left. IMPRESSION: No edema or consolidation.  No adenopathy.  No mediastinal widening. Metallic pellets on the left noted. Port-A-Cath tip at cavoatrial junction. No pneumothorax. Electronically Signed   By: Lowella Grip III M.D.   On: 04/03/2018 12:11   Dg Abd 1 View  Result Date: 04/09/2018 CLINICAL DATA:  OG tube placement EXAM: ABDOMEN - 1 VIEW COMPARISON:  CT  04/03/2018 FINDINGS: Esophageal tube tip overlies the distal stomach. Upper gas pattern is unremarkable IMPRESSION: Esophageal tube tip overlies the distal stomach Electronically Signed   By: Donavan Foil M.D.   On: 04/09/2018 20:03   Ct Head Wo  Contrast  Result Date: 04/04/2018 CLINICAL DATA:  Syncope with fall EXAM: CT HEAD WITHOUT CONTRAST TECHNIQUE: Contiguous axial images were obtained from the base of the skull through the vertex without intravenous contrast. COMPARISON:  None. FINDINGS: Brain: There is slight diffuse atrophy. There is no intracranial mass, hemorrhage, extra-axial fluid collection, or midline shift. The brain parenchyma appears unremarkable. There is no demonstrable acute infarct. Vascular: There is no hyperdense vessel. There is no appreciable vascular calcification. Skull: The bony calvarium appears intact. Sinuses/Orbits: There is mucosal thickening in several ethmoid air cells. Other paranasal sinuses are clear. There is rightward deviation of the nasal septum. Orbits appear symmetric bilaterally. Other: The mastoid air cells are clear. IMPRESSION: Slight diffuse atrophy. Brain parenchyma appears unremarkable. No mass or hemorrhage. There is mild mucosal thickening in several ethmoid air cells. There is deviation of the nasal septum toward the right. Electronically Signed   By: Lowella Grip III M.D.   On: 04/09/2018 12:10   Ct Abdomen Pelvis W Contrast  Result Date: 04/23/2018 CLINICAL DATA:  63 year old male with a history of esophageal cancer. Syncope EXAM: CT ABDOMEN AND PELVIS WITH CONTRAST TECHNIQUE: Multidetector CT imaging of the abdomen and pelvis was performed using the standard protocol following bolus administration of intravenous contrast. CONTRAST:  162m ISOVUE-300 IOPAMIDOL (ISOVUE-300) INJECTION 61% COMPARISON:  12/09/2008, 02/25/2005 FINDINGS: Lower chest: Ground-glass opacity at the periphery of the right lower lobe continuous with the diaphragm. No pleural effusion. Abnormal soft tissue at the distal esophagus near the GE junction in this patient with a given history of esophageal carcinoma. Hepatobiliary: Current CT demonstrates multiple bilateral liver masses. The largest are within the right liver,  at the segment 5/6 junction measuring 5.1 cm x 6.7 cm. None of these were present on the comparison CT exam. Trace intrahepatic biliary dilatation of the left without radiopaque stones. Gallbladder relatively decompressed with no radiopaque stones. No extrahepatic biliary ductal dilatation or radiopaque stones. Pancreas: Unremarkable pancreas Spleen: Hypodense/hypoenhancing lesion at the periphery of the spleen measuring 1.1 cm which has more uniform attenuation to spleen on the delayed images. This lesion not present on the comparison CT. Adrenals/Urinary Tract: Unremarkable adrenal glands Rounded stone within the right renal pelvis measuring 12 mm. This was not present on the comparison CT. No evidence of hydronephrosis. Mild inflammatory change/edema in the fat at the right renal pelvis. Unremarkable course of the right ureter with no additional stones. Subcentimeter hypodense cyst at the lateral cortex of the right kidney without evidence of enhancement. No evidence of left-sided hydronephrosis. Bosniak 2 cyst at the inferior left kidney. Unremarkable course of the left ureter. Urinary bladder is decompressed with circumferential wall thickening. Stomach/Bowel: Abnormal circumferential thickening of the distal esophagus with small nodularity within the adjacent fat extending through the hiatus. Balloon retention gastrostomy terminates within the stomach. Small bowel unremarkable without abnormal distention or focal thickening. No inflammatory changes. Normal appendix. Moderate stool burden without associated inflammation. No transition point or evidence of obstruction. Vascular/Lymphatic: Minimal atherosclerotic changes of the vasculature. No aneurysm. Small lymph nodes at the portacaval nodal station and at the liver hilum. No significant retroperitoneal adenopathy. Trace free fluid within the anatomic pelvis. Reproductive: Transverse diameter of the prostate estimated 19 mm Other: Small fat containing  umbilical hernia  Musculoskeletal: Degenerative changes of the visualized thoracolumbar spine. No significant bony canal narrowing. Multilevel degenerative disc disease degenerative changes of the bilateral hips. IMPRESSION: No CT finding to account for syncope. 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 Irregular soft tissue thickening at the distal esophagus and GE junction, compatible with carcinoma in this patient with given history of esophageal carcinoma. Small nodularity within the adjacent fat and evidence of likely pathologic lymph nodes at the liver hilum. Hypodense lesion at the periphery of the spleen, potentially additional metastatic disease versus benign lesion such as infarct. 12 mm stone within the right renal pelvis with mild associated inflammation and no hydronephrosis. If there is concern for infection/pyelonephritis, recommend correlation with urinalysis. Appropriately positioned balloon retention percutaneous gastrostomy tube. Aortic Atherosclerosis (ICD10-I70.0). Trace free fluid in the anatomic pelvis, likely reactive. Electronically Signed   By: Jaime  Wagner D.O.   On: 04/26/2018 15:16   Dg Chest Port 1 View  Result Date: 04/09/2018 CLINICAL DATA:  Central line placement EXAM: PORTABLE CHEST 1 VIEW COMPARISON:  04/09/2018 FINDINGS: Endotracheal tube tip measures 5.2 cm above the carina. Enteric tube tip is off the field of view but below the left hemidiaphragm. Left central venous catheter via subclavian approach with tip in the low SVC region. No pneumothorax. Right Infuse-A-Port with tip in the cavoatrial junction region. Shallow inspiration. Atelectasis or infiltration in the right lung base is similar to previous study. No blunting of costophrenic angles. No pneumothorax. Multiple metallic foreign bodies projected over the left lower chest. IMPRESSION: 1. Appliances appear in satisfactory location. 2. Persistent infiltration or  atelectasis in the right lung base. 3. No evidence of pneumothorax. Electronically Signed   By: William  Stevens M.D.   On: 04/09/2018 22:17   Dg Chest Port 1 View  Result Date: 04/09/2018 CLINICAL DATA:  Post intubation EXAM: PORTABLE CHEST 1 VIEW COMPARISON:  04/22/2018 FINDINGS: Interval intubation, tip of the endotracheal tube is about 4.7 cm superior to carina. Right-sided central venous catheter tip over the cavoatrial region. Development of hazy right lung base opacity. Normal cardiomediastinal silhouette. Multiple metallic fragments over the left chest. Esophageal tube tip is below the diaphragm but non included. IMPRESSION: 1. Endotracheal tube tip is about 4.7 cm superior to carina 2. Interval opacity at the right base which may reflect atelectasis, developing pneumonia, or mild aspiration Electronically Signed   By: Kim  Fujinaga M.D.   On: 04/09/2018 20:05    Assessment and Plan:   # VF Arrest: Etiology unclear.  He has no known cardiac history.  Echo today shows overall preserved function. There appears to be mild hypokinesis of the apical anterolateral and apical inferolateral myocardium.  However, endocardial border definition on this echo is poor.  Troponin is elevated though no ischemic changes are seen on EKG. Once extubated and more stable would recommend cardiac cath.  At the time of his arrest there were no major electrolyte abnormalities and hgb was 9.  Lactate was elevated post arrest. He continues to have some NSVT this AM.  BP has improved.  We will start metoprolol 25mg bid.  Maintain K>4, Mg>2.       Time spent: 45 minutes-Greater than 50% of this time was spent in counseling, explanation of diagnosis, planning of further management, and coordination of care.   For questions or updates, please contact [facility] Please consult [email or web address] for contact info under     Signed, Tiffany Flemington, MD  04/10/2018 9:09 AM   

## 2018-04-11 ENCOUNTER — Encounter (HOSPITAL_COMMUNITY): Payer: Self-pay | Admitting: Primary Care

## 2018-04-11 DIAGNOSIS — Z515 Encounter for palliative care: Secondary | ICD-10-CM

## 2018-04-11 DIAGNOSIS — I6340 Cerebral infarction due to embolism of unspecified cerebral artery: Secondary | ICD-10-CM

## 2018-04-11 DIAGNOSIS — Z7189 Other specified counseling: Secondary | ICD-10-CM

## 2018-04-11 DIAGNOSIS — I63411 Cerebral infarction due to embolism of right middle cerebral artery: Secondary | ICD-10-CM

## 2018-04-11 DIAGNOSIS — I609 Nontraumatic subarachnoid hemorrhage, unspecified: Secondary | ICD-10-CM

## 2018-04-11 DIAGNOSIS — I6330 Cerebral infarction due to thrombosis of unspecified cerebral artery: Secondary | ICD-10-CM

## 2018-04-11 DIAGNOSIS — I4901 Ventricular fibrillation: Secondary | ICD-10-CM

## 2018-04-11 DIAGNOSIS — I619 Nontraumatic intracerebral hemorrhage, unspecified: Secondary | ICD-10-CM

## 2018-04-11 LAB — TYPE AND SCREEN
ABO/RH(D): A POS
Antibody Screen: NEGATIVE
Unit division: 0
Unit division: 0
Unit division: 0
Unit division: 0
Unit division: 0

## 2018-04-11 LAB — BPAM RBC
Blood Product Expiration Date: 202002022359
Blood Product Expiration Date: 202002022359
Blood Product Expiration Date: 202002042359
Blood Product Expiration Date: 202002042359
Blood Product Expiration Date: 202002042359
ISSUE DATE / TIME: 202001100043
ISSUE DATE / TIME: 202001100316
ISSUE DATE / TIME: 202001111640
ISSUE DATE / TIME: 202001091848
ISSUE DATE / TIME: 202001112256
Unit Type and Rh: 6200
Unit Type and Rh: 6200
Unit Type and Rh: 6200
Unit Type and Rh: 6200
Unit Type and Rh: 6200

## 2018-04-11 MED ORDER — MORPHINE SULFATE (PF) 2 MG/ML IV SOLN: 2.0000 mg | INTRAVENOUS | Status: DC | PRN

## 2018-04-11 MED ORDER — MORPHINE BOLUS VIA INFUSION
5.0000 mg | INTRAVENOUS | Status: DC | PRN
  Filled 2018-04-11: qty 5

## 2018-04-11 MED ORDER — DEXTROSE 5 % IV SOLN
INTRAVENOUS | Status: DC
  Administered 2018-04-11: 10:00:00 via INTRAVENOUS

## 2018-04-11 MED ORDER — MORPHINE 100MG IN NS 100ML (1MG/ML) PREMIX INFUSION
0.0000 mg/h | INTRAVENOUS | Status: DC
  Administered 2018-04-11 – 2018-04-12 (×2): 5 mg/h via INTRAVENOUS
  Filled 2018-04-11 (×2): qty 100

## 2018-04-11 NOTE — Progress Notes (Signed)
Deferring assessments at this time. Patient is comfort care. Pt resting comfortably and denies any needs. Family at the bedside updated of shift change and questions answered. Monitoring quietly. Aayden Cefalu, Rande Brunt, RN

## 2018-04-11 NOTE — Progress Notes (Signed)
Patient is resting in bed with wife and brother at bedside. Arterial line has been discontinued and all IVs are now Saline Locked. Patient has been asking for water and has accepted ice chips from wife and brother.

## 2018-04-11 NOTE — Progress Notes (Signed)
Reviewed events of last 24 h.  Massive R hemispheric stroke with dismal prognosis. Reviewed his last 2 echos. From 01/10 to 01/12 there is a distinct (albeit small) new apico-lateral wall motion abnormality consistent with an area of infarction. Although small, it likely explains the episodes of ventricular arrhythmia.  For multiple reasons, he would not benefit from further cardiac workup and treatment options would be very limited. Note plans for comfort care. Will sign off. Please call us back with any new questions.  Sanda Klein, MD, [facility] [phone] office [phone] pager

## 2018-04-11 NOTE — Progress Notes (Signed)
NAME:  Drew Vega MRN:  [id number] DOB:  [birth year] LOS: 4 ADMISSION DATE:  04/11/2018  CONSULTATION DATE:  04/09/2018 REFERRING MD:  Dr. Bonnielee Haff  REASON FOR CONSULTATION:  Ventricular fibrillation   Brief History   This 63 y.o. 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 He was initially admitted for further evaluation and management of GI bleed and acute blood loss anemia.  On 1/12 patient complained of burning epigastric pain and suddenly became unresponsive. CODE BLUE was called, and he was found to be in ventricular fibrillation. ROSC was achieved in about 2 minutes (chest compressions, cardioversion, 1 amp bicarbonate). The patient was intubated per ACLS protocol, and the patient has transferred to ICU for further management.   Past Medical History  Hypertension, adenocarcinoma of esophagus  Significant Hospital Events   1/9: admitted for syncope, hematemesis. Found to be anemic and hypotensive. 1/10: EGD showed stigmata of esophageal ulcer bleeding (clot). 1/11: CODE BLUE after epigastric pain. V-fib. ROSC in 2 min. Intubated. Tx to ICU. 1/13: Altered mental status from large MCA infarct, transferred to [facility] for neuro eval Made comfort care  Consults:  GI Radiology (visceral arteriogram and embolization) Neurology PCCM  Procedures:  02/2018: PEG placement 1/10: EGD 1/10: visceral arteriogram and embolization  OETT 1/11 > 1/12 LT subclavian CVL 1/11 >   Significant Diagnostic Tests:  1/10: 2D echo LVEF 65-70%.  Micro Data:    Antimicrobials:  Cefepime/Flagyl 1/11>> 1/13   Interim history/subjective:  Extubated yesterday morning.  He is stable after extubation  Yesterday evening noted to have acute neurologic deficit.  Transferred to [facility] hospital after CT scan showed large MCA infarct. Family  discussed with neurology and decided given poor prognosis to transition to comfort care.  Objective   BP (!) 149/92   Pulse (!) 113   Temp 98.4 F (36.9 C) (Axillary)   Resp (!) 25   Ht 5\' 7"  (1.702 m)   Wt 82.2 kg   SpO2 97%   BMI 28.38 kg/m  CVP:  [12 mmHg-17 mmHg] 15 mmHg  Filed Weights   04/02/2018 1738 04/24/2018 1100 04/10/18 2054  Weight: 71.8 kg 71.8 kg 82.2 kg    Intake/Output Summary (Last 24 hours) at 04/11/2018 0901 Last data filed at 04/11/2018 0600 Gross per 24 hour  Intake 458.93 ml  Output 1400 ml  Net -941.07 ml    Vent Mode: PSV FiO2 (%):  [40 %] 40 % PEEP:  [5 cmH20] 5 cmH20 Pressure Support:  [5 cmH20] 5 cmH20   Examination: Gen:      Mild distress HEENT:  EOMI, sclera anicteric Neck:     No masses; no thyromegaly Lungs:    Clear to auscultation bilaterally; normal respiratory effort CV:         Regular rate and rhythm; no murmurs Abd:      + bowel sounds; soft, non-tender; no palpable masses, no distension Ext:    No edema; adequate peripheral perfusion Skin:      Warm and dry; no rash Neuro: Awake, confused.  Hemiparesis of left side.  Resolved Hospital Problem list   N/A   Assessment & Plan:  63 year old with complicated medical history of metastatic esophageal cancer, GI bleed from esophageal ulcer, V. fib arrest now with large large right MCA, PCA infarct  Family after discussion with neurology overnight have decided to transition to comfort care I have discussed  again with brother and wife at bedside Family is aawre that his prognosis is grim given his cancer, cardiac arrest GI bleed and now stroke.  They want him to be comfortable and have requested evaluation for hospice placement. Consulted palliative care placed.   Start morphine drip as he is still uncomfortable on intermittent doses of morphine  Transfer back to [facility] service.  Marshell Garfinkel MD [facility] 04/11/2018, 9:12 AM

## 2018-04-11 NOTE — Consult Note (Addendum)
Consultation Note Date: 04/11/2018   Patient Name: Drew Vega  DOB: 11-05-1955  MRN: [id number]  Age / Sex: 63 y.o., male  PCP: [facility] Referring Physician: Marshell Garfinkel, MD  Reason for Consultation: Establishing goals of care and Psychosocial/spiritual support  HPI/Patient Profile: 63 y.o. male  with past medical history of etastatic esophageal cancer who completed radiation treatment 1/11, with plan to initiate chemotherapy in the near future, prostatectomy,  admitted on 04/13/2018 with stroke.   Clinical Assessment and Goals of Care: Mr. Dimalanta is lying quietly in bed.  He appears acutely ill and frail, actively dying.  Present today at bedside his wife Neoma Laming and brother Ronalee Belts.  We talked about Mr. Crutchfield acute health problems.  Family is electing full comfort care, let nature take its course.  We talked about symptom management, residential hospice.  We talked about preferred place of death.  Family states that Mr. Cueto had never mentioned preferred place of death.  They share that until Jun 27, 2022 he had been focused on treatment plan for his cancer.  We talked about residential hospice in detail, specialized medical care at end-of-life.  At this point, Hilda Blades and Ronalee Belts request Mr. Schmieg be allowed to pass in the hospital.  Family states that Mr. Stene has not had any nutrition since 2022-06-27.  Anticipate time is short, in hospital death.  Family states that Mr. Alegria has a close network of friends, and they feel it would benefit him for friends to be able to interact with him during this time.  Conference with nursing staff related to patient needs, symptom management.  HCPOA   NEXT OF KIN - wife Hilda Blades. Brother Ronalee Belts at bedside.    SUMMARY OF RECOMMENDATIONS   Full comfort care  Code Status/Advance Care Planning:  DNR  Symptom Management:   Morphine continuous  infusion  Palliative Prophylaxis:   Frequent Pain Assessment and Turn Reposition  Additional Recommendations (Limitations, Scope, Preferences):  Full Comfort Care  Psycho-social/Spiritual:   Desire for further Chaplaincy support:no  Additional Recommendations: Caregiving  Support/Resources and Education on Hospice  Prognosis:   Hours - Days  Discharge Planning: Anticipated Hospital Death      Primary Diagnoses: Present on Admission: . Syncope . Shock ([place]) . Primary adenocarcinoma of distal third of esophagus ([place]) . Liver metastases ([place]) . Hematemesis . Normocytic anemia . Thrombocytopenia ([place]) . Hyponatremia . Abnormal LFTs   I have reviewed the medical record, interviewed the patient and family, and examined the patient. The following aspects are pertinent.  Past Medical History:  Diagnosis Date  . Adenocarcinoma of esophagus, stage 4 ([facility])   . Hypertension    Social History   Socioeconomic History  . Marital status: Single    Spouse name: Not on file  . Number of children: Not on file  . Years of education: Not on file  . Highest education level: Not on file  Occupational History  . Not on file  Social Needs  . Financial resource strain: Not on file  . Food insecurity:  Worry: Not on file    Inability: Not on file  . Transportation needs:    Medical: No    Non-medical: No  Tobacco Use  . Smoking status: Never Smoker  . Smokeless tobacco: Never Used  Substance and Sexual Activity  . Alcohol use: Yes    Comment: occasional  . Drug use: Not on file  . Sexual activity: Not on file  Lifestyle  . Physical activity:    Days per week: Not on file    Minutes per session: Not on file  . Stress: Not on file  Relationships  . Social connections:    Talks on phone: Not on file    Gets together: Not on file    Attends religious service: Not on file    Active member of club or organization: Not on file    Attends meetings of clubs or  organizations: Not on file    Relationship status: Not on file  Other Topics Concern  . Not on file  Social History Narrative  . Not on file   Family History  Problem Relation Age of Onset  . Throat cancer Mother    Scheduled Meds: Continuous Infusions: . dextrose 20 mL/hr at 04/11/18 1400  . morphine 5 mg/hr (04/11/18 1400)   PRN Meds:.acetaminophen **OR** acetaminophen, antiseptic oral rinse, fentaNYL (SUBLIMAZE) injection, glycopyrrolate **OR** glycopyrrolate **OR** glycopyrrolate, morphine injection, morphine injection, morphine, ondansetron **OR** ondansetron (ZOFRAN) IV, oxyCODONE, polyvinyl alcohol Medications Prior to Admission:  Prior to Admission medications   Medication Sig Start Date End Date Taking? Authorizing Provider  amLODipine (NORVASC) 10 MG tablet Take 10 mg by mouth daily.   Yes [provider]  aspirin EC 81 MG tablet Take 81 mg by mouth daily.   Yes [provider]  atenolol (TENORMIN) 100 MG tablet Take 100 mg by mouth daily.   Yes [provider]  chlorthalidone (HYGROTON) 25 MG tablet Take 12.5 mg by mouth daily. Take one half tablet daily.    Yes [provider]  lisinopril (PRINIVIL,ZESTRIL) 40 MG tablet Take 20 mg by mouth daily.    Yes [provider]  Omega-3 Fatty Acids (FISH OIL) 1000 MG CAPS Take 1,000 mg by mouth daily.   Yes [provider]  omeprazole (PRILOSEC) 20 MG capsule Take 20 mg by mouth daily.   Yes [provider]  sildenafil (VIAGRA) 100 MG tablet Take 100 mg by mouth daily as needed for erectile dysfunction.   Yes [provider]   No Known Allergies Review of Systems  Unable to perform ROS: Patient unresponsive    Physical Exam Vitals signs and nursing note reviewed.  Constitutional:      Comments: Appears acutely ill, actively dying  HENT:     Head: Atraumatic.  Cardiovascular:     Rate and Rhythm: Normal rate.  Pulmonary:     Effort: Pulmonary effort  is normal.  Musculoskeletal:        General: Swelling present.  Skin:    General: Skin is warm and dry.     Vital Signs: BP 128/85   Pulse (!) 131   Temp 99.7 F (37.6 C) (Oral)   Resp 18   Ht 5\' 7"  (1.702 m)   Wt 82.2 kg   SpO2 93%   BMI 28.38 kg/m  Pain Scale: Faces   Pain Score: Asleep   SpO2: SpO2: 93 % O2 Device:SpO2: 93 % O2 Flow Rate: .O2 Flow Rate (L/min): 2 L/min  IO: Intake/output summary:  Intake/Output Summary (Last 24 hours) at 04/11/2018 1619 Last data filed at 04/11/2018 1400 Gross per 24 hour  Intake 140.98 ml  Output 1325 ml  Net -1184.02 ml    LBM: Last BM Date: 04/09/18 Baseline Weight: Weight: 71.8 kg Most recent weight: Weight: 82.2 kg     Palliative Assessment/Data:   Flowsheet Rows     Most Recent Value  Intake Tab  Referral Department  Hospitalist  Palliative Care Primary Diagnosis  Other (Comment)  Date Notified  04/10/18  Palliative Care Type  New Palliative care  Reason for referral  Counsel Regarding Hospice, Clarify Goals of Care, End of Life Care Assistance  Date of Admission  04/06/2018  Date first seen by Palliative Care  04/11/18  # of days Palliative referral response time  1 Day(s)  # of days IP prior to Palliative referral  3  Clinical Assessment  Palliative Performance Scale Score  20%  Pain Max last 24 hours  Not able to report  Pain Min Last 24 hours  Not able to report  Dyspnea Max Last 24 Hours  Not able to report  Dyspnea Min Last 24 hours  Not able to report  Psychosocial & Spiritual Assessment  Palliative Care Outcomes  Patient/Family meeting held?  Yes  Who was at the meeting?  wife Hilda Blades and brother Ronalee Belts  Palliative Care Outcomes  Counseled regarding hospice, Provided end of life care assistance, Clarified goals of care  Patient/Family wishes: Interventions discontinued/not started   Mechanical Ventilation      Time In: 1510 Time Out: 1620 Time Total: 70 minutes Greater than 50%  of this time was spent  counseling and coordinating care related to the above assessment and plan.  Signed by: Drue Novel, NP   Please contact Palliative Medicine Team phone at [phone] for questions and concerns.  For individual provider: See Shea Evans

## 2018-04-11 NOTE — Progress Notes (Signed)
Report given to RN on 6N and family is made aware of the patient's transfer. Will transfer when RN is back on the unit. Orlie Cundari, Rande Brunt, RN

## 2018-04-11 NOTE — Progress Notes (Signed)
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 operculum, and supra ganglionic cortical gray matter. Probable old subtle hypodensity within the gray matter of the anterior right temporal lobe as well subtle hypodensity now evident at the right caudate and lentiform nuclei and internal capsule as well. No significant regional mass effect at this time. No acute intracranial hemorrhage. Remainder the brain is unchanged in appearance. Vascular: Residual contrast material within the intracranial circulation related to recent CTA. Scattered vascular calcifications noted within the carotid siphons. Skull: Scalp soft tissues and calvarium within normal limits. Sinuses/Orbits: Globes and orbital soft tissues demonstrate no acute finding. Right gaze noted. Paranasal sinuses and mastoid air cells remain clear. Other: None. ASPECTS [facility] Stroke Program Early CT Score) - Ganglionic level infarction (caudate, lentiform nuclei, internal capsule, insula, M1-M3 cortex): 2 - Supraganglionic infarction (M4-M6 cortex): 0 Total score (0-10 with 10 being normal): 3 IMPRESSION: 1. Large evolving right MCA territory ischemic infarct, now evident as compared to recent CT from earlier the same day. No associated intracranial hemorrhage. 2. ASPECTS is 2,  previously 9. 3. Unchanged appearance of evolving acute to early subacute left PCA territory infarct. These results were communicated to Dr. Leonel Ramsay at 8:49 pmon 1/12/2020by text page via the [facility] messaging system. Electronically Signed   By: Jeannine Boga M.D.   On: 04/10/2018 20:50   Ct Head Code Stroke Wo Contrast  Result Date: 04/10/2018 CLINICAL DATA:  Code stroke. EXAM: CT HEAD WITHOUT CONTRAST TECHNIQUE: Contiguous axial images were obtained from the base of the skull through the vertex without intravenous contrast. COMPARISON:  Prior CT from 04/06/2018. FINDINGS: Brain: Generalized age-related cerebral atrophy with mild chronic small vessel ischemic disease. Small remote lacunar infarct present within the right thalamus. Evolving hypodensity within the parasagittal aspect of the left occipital lobe, consistent with acute ischemic left PCA territory infarct (series 2, image 16). No associated hemorrhage or significant mass effect. Additionally, there is a subtle small focus of hypodensity at the posterior aspect of the right sylvian fissure, suspicious for a tiny acute ischemic cortical infarct (series 2, image 22), right MCA territory. No other evidence for acute large vessel territory infarct. No intracranial hemorrhage. No mass lesion, midline shift, or mass effect. No extra-axial fluid collection. Vascular: No asymmetric hyperdense vessel. Skull: Scalp soft tissues and calvarium within normal limits. Sinuses/Orbits: Globes and orbital soft tissues demonstrate no acute abnormality. Right gaze noted. Paranasal sinuses and mastoids are clear. Other: None. ASPECTS [facility] Stroke Program Early CT Score) - Ganglionic level infarction (caudate, lentiform nuclei, internal capsule, insula, M1-M3 cortex): 7 - Supraganglionic infarction (M4-M6 cortex): 2 Total score (0-10 with 10 being normal): 9 IMPRESSION: 1. Subtle hypodensity involving the cortical gray matter at the posterior aspect of the  right sylvian fissure, suspicious for a small acute right MCA territory infarct. No intracranial hemorrhage. 2. ASPECTS is 9. 3. Additional evolving acute ischemic left PCA territory infarct. No associated hemorrhage or mass effect. Critical Value/emergent results were called by telephone at the time of interpretation on 04/10/2018 at 6:23 pm to Dr. Marshell Garfinkel , who verbally acknowledged these results. Electronically Signed   By: Marland Kitchen  Jeannine Boga M.D.   On: 04/10/2018 18:34    PHYSICAL EXAM  Temp:  [98.2 F (36.8 C)-101.3 F (38.5 C)] 98.4 F (36.9 C) (01/12 2054) Pulse Rate:  [87-115] 113 (01/12 2300) Resp:  [17-33] 30 (01/13 1000) BP: (111-149)/(72-92) 111/81 (01/13 1000) SpO2:  [94 %-100 %] 97 % (01/13 1000) Arterial Line BP: (162)/(68) 162/68 (01/12 1950) Weight:  [82.2 kg] 82.2 kg (01/12 2054)  General - Well nourished, well developed, in mild distress of HA, sleepy.  Ophthalmologic - fundi not visualized due to noncooperation.  Cardiovascular - Regular rate and rhythm.  Neuro - limited exam due to comfort care measure.  Patient sleepy, eyes closed.  Intermittently moving right upper and lower extremities spontaneously.  Kept holding his head with right hand likely due to headache.  Right upper extremity at least 4/5 and left lower extremity at least 3/5.  However, no movement of left upper or left lower extremities.  Left facial droop and no distress of breathing.   ASSESSMENT/PLAN Mr. Jill Ruppe is a 63 y.o. male with history of esophageal cancer with liver metastasis, upper GI bleeding and hypertension admitted for slurred speech and left-sided hemiplegia. No tPA given due to recent GI bleeding.    Stroke:  right large MCA infarct, embolic, source uncertain but could be due to hypercarbia state with advanced malignancy  Resultant left hemiplegia  CTA head and neck showed right M1 occlusion  CT perfusion showed large right MCA core infarct, 166 cc  Given  significant sent comorbidities, poor baseline and current large MCA infarct with poor prognosis, family requested comfort care  Currently in morphine drip for comfort care  I answered patient brother and wife's questions at bedside, reviewed brain imaging with them and they expressed appreciation  Other Active Problems  Esophageal cancer with liver metastasis  Recent upper GI bleeding  Hospital day # 4  Neurology will sign off. Please call with questions. Thanks for the consult.   Rosalin Hawking, MD PhD Stroke Neurology 04/11/2018 10:35 AM    To contact Stroke Continuity provider, please refer to [email or web address] After hours, contact General Neurology

## 2018-04-11 NOTE — Progress Notes (Signed)
Nutrition Brief Note  Chart reviewed. Pt now transitioning to comfort care.  No further nutrition interventions warranted at this time.  Please re-consult as needed.   Krysta Bloomfield RD, LDN, CNSC [phone] Pager [phone] After Hours Pager    

## 2018-04-11 NOTE — Progress Notes (Signed)
Patient holding head and moaning. Family made RN aware. PRN pain medications given twice within an hour. MD aware. Lillyanna Glandon, Rande Brunt, RN

## 2018-04-12 LAB — CULTURE, BLOOD (ROUTINE X 2)
Culture: NO GROWTH
Culture: NO GROWTH
SPECIAL REQUESTS: ADEQUATE
SPECIAL REQUESTS: ADEQUATE

## 2018-04-12 LAB — CULTURE, RESPIRATORY

## 2018-04-12 LAB — CULTURE, RESPIRATORY W GRAM STAIN: Culture: NORMAL

## 2018-04-13 ENCOUNTER — Telehealth: Payer: Self-pay | Admitting: Pulmonary Disease

## 2018-04-13 NOTE — Telephone Encounter (Signed)
04/13/2018 I received D/C from [facility] sent by courier to Dr.Mannam at Pulmonary on [address]. PWR   04/14/2018 Received signed D/C back from Dr. [facility] [employer] to pick up. PWR

## 2018-04-14 NOTE — Discharge Summary (Signed)
Physician Death Summary  Patient ID: Drew Vega MRN: [id number] DOB/AGE: 04-27-1955 63 y.o.  Admit date: 04/07/2018  Discharge date: 05-05-202019  Admission Diagnoses:  GI bleed  Discharge Diagnoses:  Acute stroke Right MCA, PCA infarct Ventricular fibrillation cardiac arrest GI bleed secondary to esophageal ulcer Metastatic esophageal cancer  Discharged Condition: Deceased  Hospital Course:  This 63 y.o. 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 He was initially admitted for further evaluation and management of GI bleed and acute blood loss anemia.  On 1/12 patient complained of burning epigastric pain and suddenly became unresponsive. CODE BLUE was called, and he was found to be in ventricular fibrillation. ROSC was achieved in about 2 minutes (chest compressions, cardioversion, 1 amp bicarbonate). The patient was intubated per ACLS protocol, and the patient has transferred to ICU for further management.   Patient stabilized the next day and extubated on 1/13.  On the evening after extubation he had altered mental status and found to have a large MCA, PCA infarct.  Transferred to [facility] for neuro evaluation.  Neuro consult done and felt was not a candidate for any aggressive intervention due to his severe comorbidities.  After discussion with family he was transitioned into comfort care  Consults: Gastroenterology, neurology, palliative care, cardiology  Signed: Kalana Yust 04/14/2018, 11:31 AM

## 2018-04-14 NOTE — Progress Notes (Signed)
  Radiation Oncology         [phone]) [phone] ________________________________  Name: Drew Vega MRN: [id number]  Date: 04/06/2018  DOB: 01-05-1956  End of Treatment Note  Diagnosis:   63 y.o. 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  He experienced increased fatigue and difficulty with swallowing. He started tube feedings via PEG, going well so far.   Plan: The patient has completed radiation treatment. The patient will return to radiation oncology clinic for routine followup in one month. I advised them to call or return sooner if they have any questions or concerns related to their recovery or treatment.  ------------------------------------------------  Jodelle Gross, MD, PhD  This document serves as a record of services personally performed by Kyung Rudd, MD. It was created on his behalf by Rae Lips, a trained medical scribe. The creation of this record is based on the scribe's personal observations and the provider's statements to them. This document has been checked and approved by the attending provider.

## 2018-04-30 NOTE — Progress Notes (Signed)
Morphine drip d/c'd and 97.5 cc wasted in stericycle; witnessed by Gypsy Lore., RN

## 2018-04-30 NOTE — Discharge Summary (Signed)
Physician Death Summary  Patient ID: Drew Vega MRN: [id number] DOB/AGE: 04/01/1955 63 y.o.  Admit date: 21-Apr-2018 Death date: 04-26-18  Admission Diagnoses: Syncope  Discharge Diagnoses:  Acute CVA V. fib arrest GI bleed Esophageal ulcer with bleed Stage IV esophageal cancer.  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 He was initially admitted for further evaluation and management of GI bleed and acute blood loss anemia.  Underwent EGD found to have esophageal ulcer with large clot.  Scope could not be passed beyond the stricture and interventions not done  On 1/12 patient complained of burning epigastric pain and suddenly became unresponsive. CODE BLUE was called, and he was found to be in ventricular fibrillation. ROSC was achieved in about 2 minutes (chest compressions, cardioversion, 1 amp bicarbonate). The patient was intubated per ACLS protocol, and the patient has transferred to ICU for further management.   The next day his mental status improved and patient self extubated successfully.  Patient was okay for a few hours after extubation.  Later in the day he had acute alteration in mental status.  Underwent work-up for stroke and transferred to [facility] for neuro evaluation.  He was found to have a large vessel occlusion with massive right hemispheric infarction, pseudonormalization of the left PCA.  He had a very poor prognosis given multiple comorbidities and family decided to transition to comfort care.  He was started on morphine for symptom management and passed away on Apr 26, 2018  Consults: GI, neurology, pulmonary critical care, palliative care  Signed: Jocob Dambach 04/19/2018, 1:34 PM

## 2018-04-30 DEATH — deceased

## 2018-05-11 ENCOUNTER — Ambulatory Visit: Payer: Self-pay | Admitting: Radiation Oncology

## 2019-10-26 IMAGING — CT CT HEAD CODE STROKE
3 series · 14 of 47 positions shown, 16 images · non-contrast
Comparison: Prior CT from 04/07/2018.

CLINICAL DATA: Code stroke.

EXAM:
CT HEAD WITHOUT CONTRAST
TECHNIQUE: Contiguous axial images were obtained from the base of the skull
through the vertex without intravenous contrast.

[Series 2: head wo · axial · 0.47mm/px · z∈[-67,+78]mm · 8 of 35 slices shown, 10 images]
[im 3/35  brain]
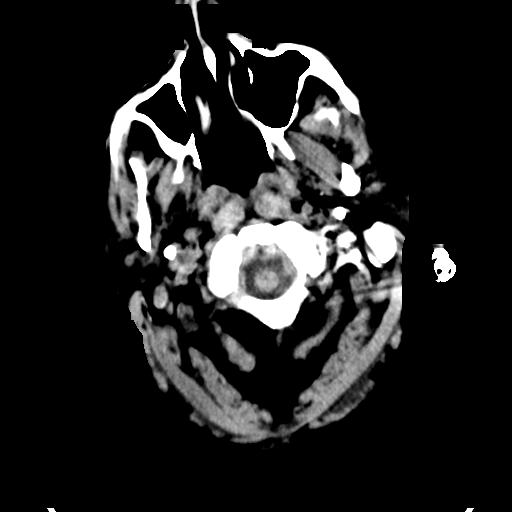
[im 3/35  bone]
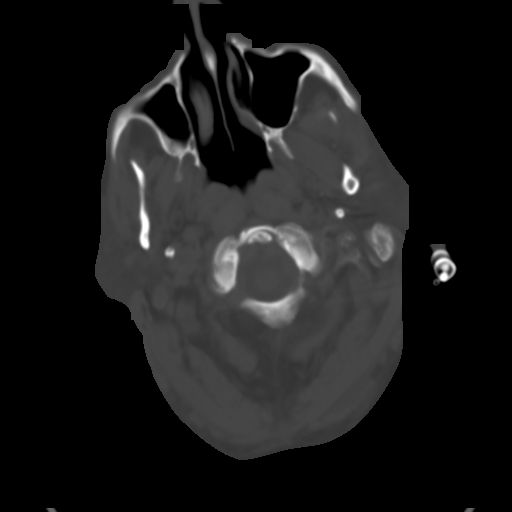
[im 8/35  brain]
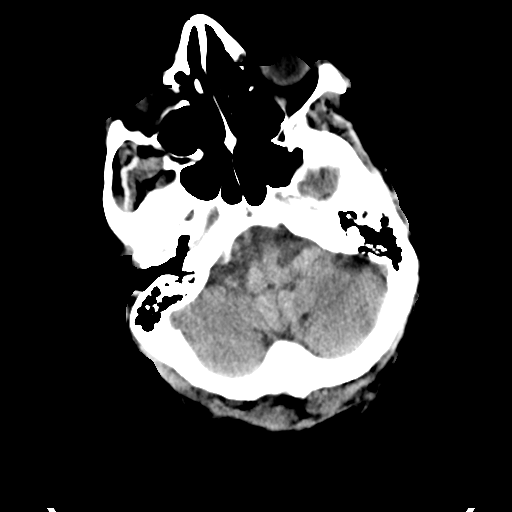
[im 11/35  brain]
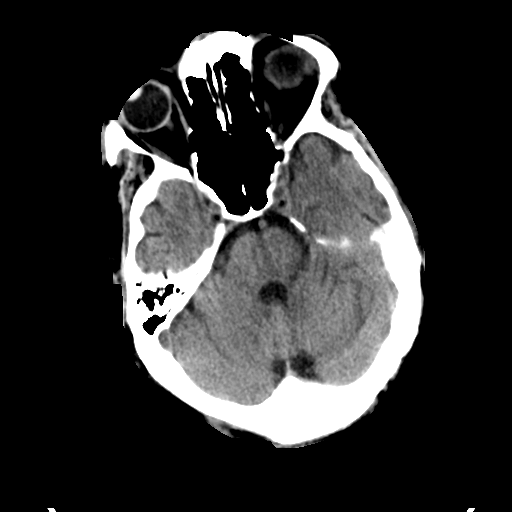
[im 16/35  brain]
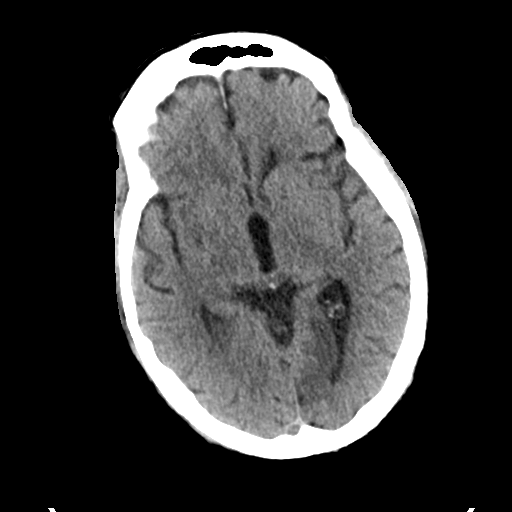
[im 19/35  brain]
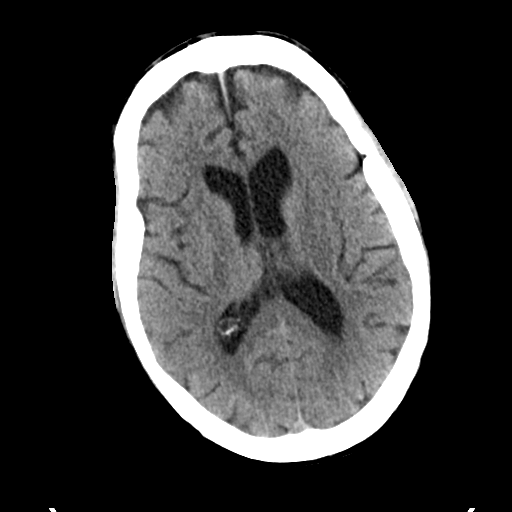
[im 19/35  bone]
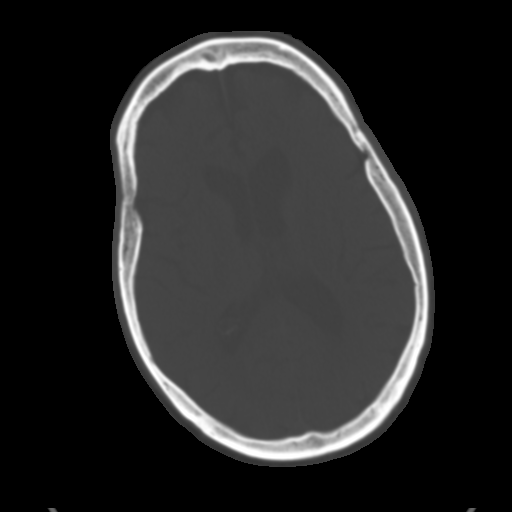
[im 24/35  brain]
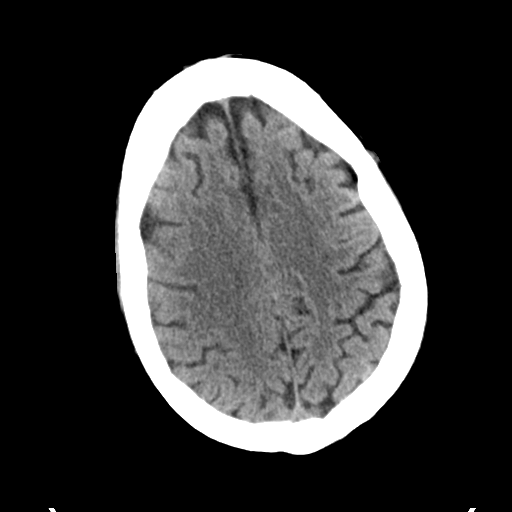
[im 27/35  brain]
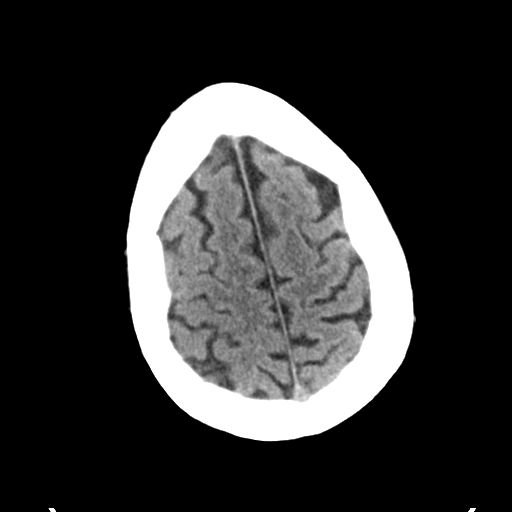
[im 32/35  brain]
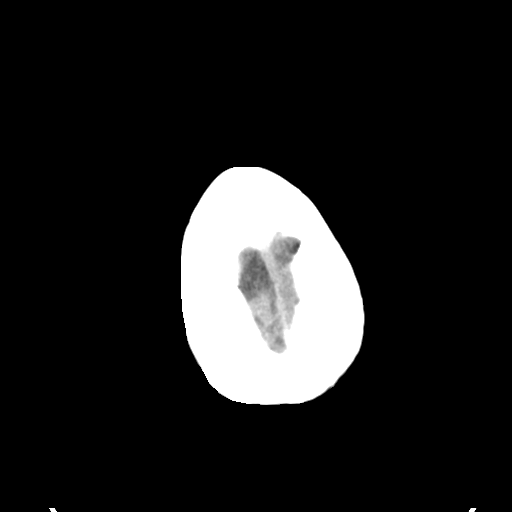

[Series 4: coronal soft tissue · coronal · 0.32mm/px · 3 of 73 slices shown]
[im 25/73  brain]
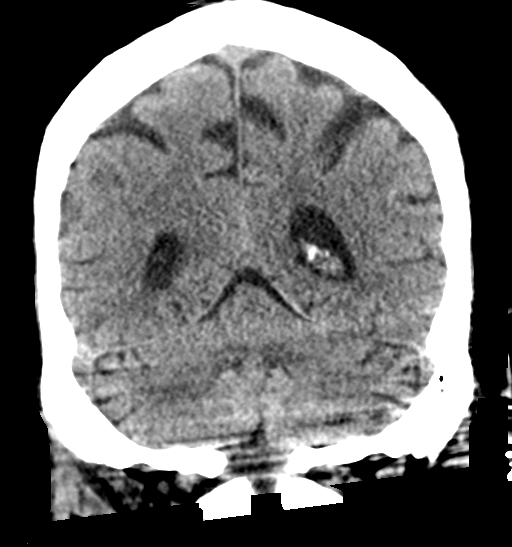
[im 33/73  brain]
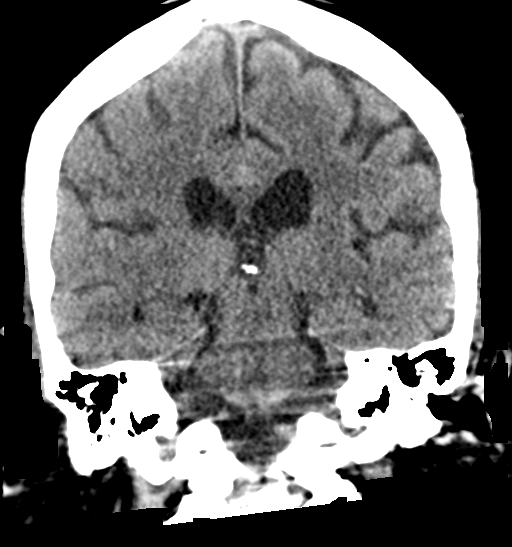
[im 41/73  brain]
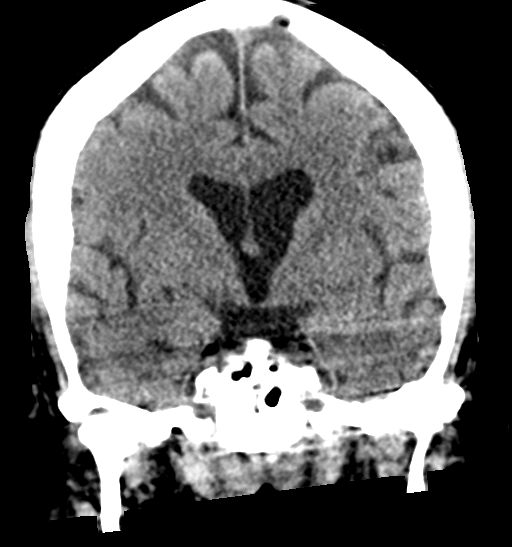

[Series 5: sagittal soft tissue · sagittal · 0.34mm/px · 3 of 56 slices shown]
[im 19/56  brain]
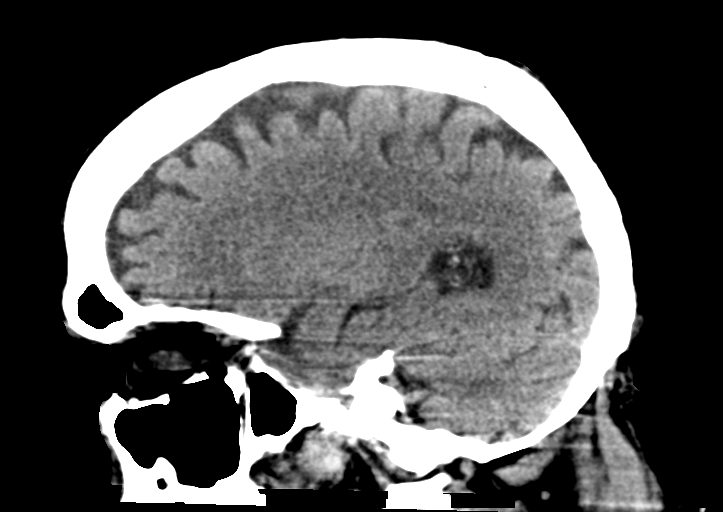
[im 28/56  brain]
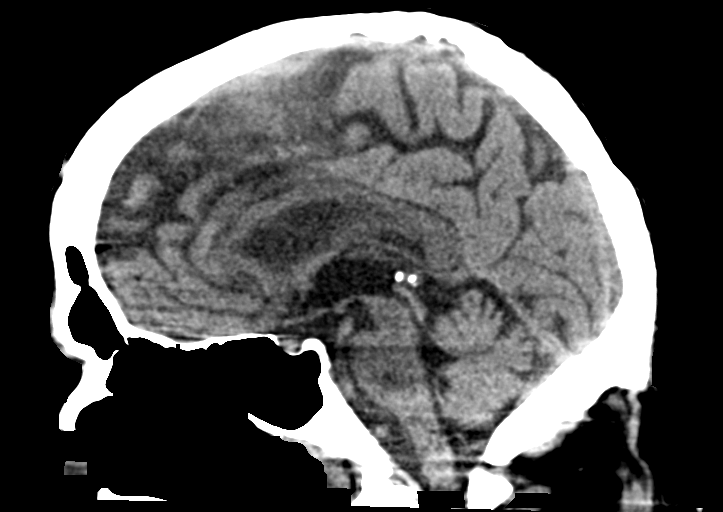
[im 37/56  brain]
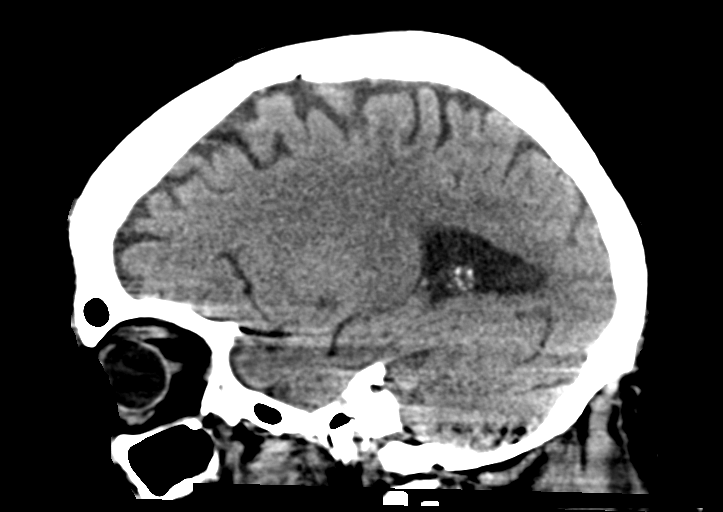

[14 of 47 positions shown; findings below may reference images not displayed]

FINDINGS: Brain: Generalized age-related cerebral atrophy with mild chronic
small vessel ischemic disease. Small remote lacunar infarct present
within the right thalamus.

Evolving hypodensity within the parasagittal aspect of the left
occipital lobe, consistent with acute ischemic left PCA territory
infarct (series 2, image 16). No associated hemorrhage or
significant mass effect.

Additionally, there is a subtle small focus of hypodensity at the
posterior aspect of the right sylvian fissure, suspicious for a tiny
acute ischemic cortical infarct (series 2, image 22), right MCA
territory. No other evidence for acute large vessel territory
infarct. No intracranial hemorrhage. No mass lesion, midline shift,
or mass effect. No extra-axial fluid collection.

Vascular: No asymmetric hyperdense vessel.

Skull: Scalp soft tissues and calvarium within normal limits.

Sinuses/Orbits: Globes and orbital soft tissues demonstrate no acute
abnormality. Right gaze noted. Paranasal sinuses and mastoids are
clear.

Other: None.

ASPECTS ([place] Stroke Program Early CT Score)

- Ganglionic level infarction (caudate, lentiform nuclei, internal
capsule, insula, M1-M3 cortex): 7

- Supraganglionic infarction (M4-M6 cortex): 2

Total score (0-10 with 10 being normal): 9
IMPRESSION: 1. Subtle hypodensity involving the cortical gray matter at the
posterior aspect of the right sylvian fissure, suspicious for a
small acute right MCA territory infarct. No intracranial hemorrhage.
2. ASPECTS is 9.
3. Additional evolving acute ischemic left PCA territory infarct. No
associated hemorrhage or mass effect.

Critical Value/emergent results were called by telephone at the time
of interpretation on 04/10/2018 at [DATE] to Dr. RUPA KUMARI KUTTAN ,
who verbally acknowledged these results.

## 2019-10-26 IMAGING — CT CT HEAD CODE STROKE
4 series · 16 of 47 positions shown, 18 images · non-contrast
Comparison: Prior CT and CTA from earlier the same day.

CLINICAL DATA: Code stroke.  Initial evaluation for acute weakness.

EXAM:
CT HEAD WITHOUT CONTRAST
TECHNIQUE: Contiguous axial images were obtained from the base of the skull
through the vertex without intravenous contrast.

[Series 3: head bone · axial · 0.47mm/px · z∈[-104,-68]mm · 3 of 94 slices shown]
[im 10/94  bone]
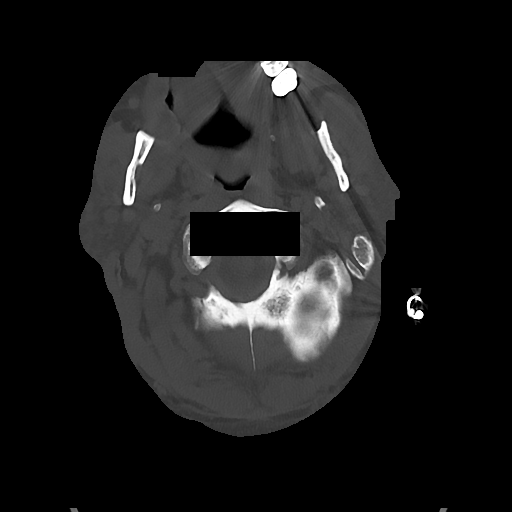
[im 19/94  bone]
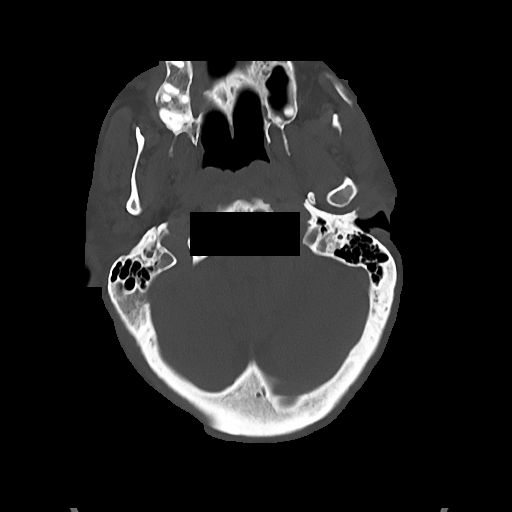
[im 28/94  bone]
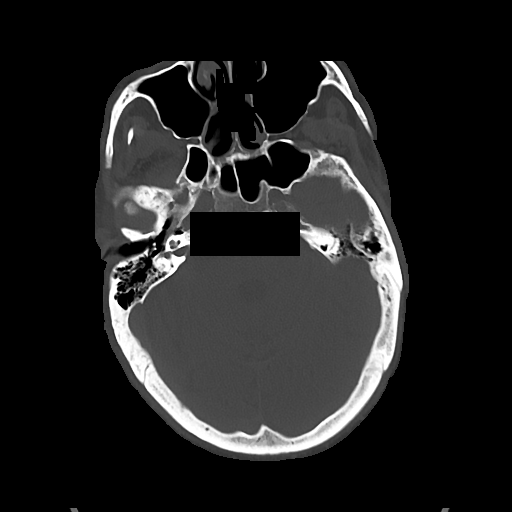

[Series 4: head wo · axial · 0.47mm/px · z∈[-102,+38]mm · 7 of 38 slices shown, 9 images]
[im 5/38  brain]
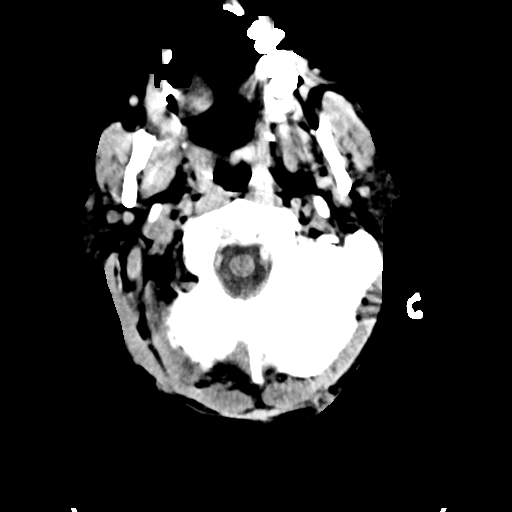
[im 5/38  bone]
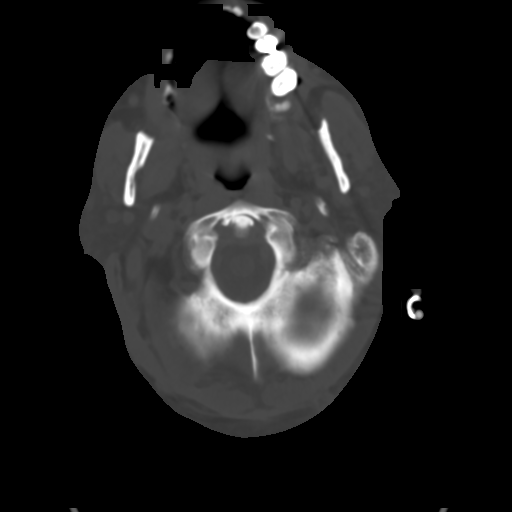
[im 10/38  brain]
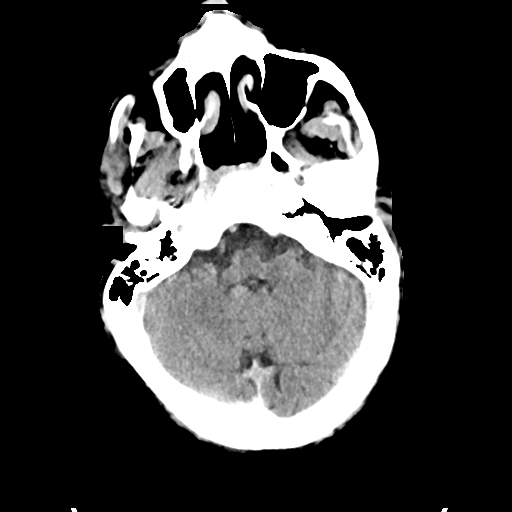
[im 14/38  brain]
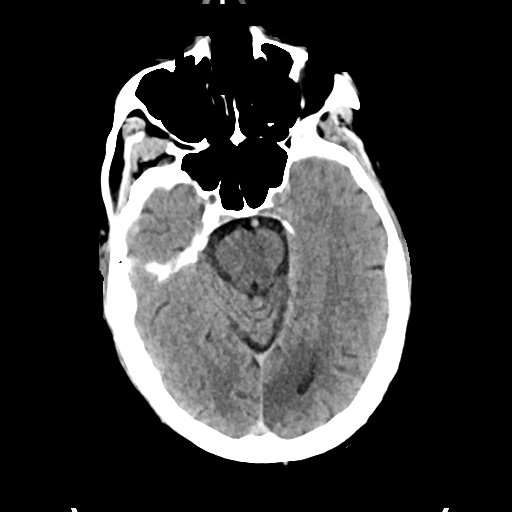
[im 19/38  brain]
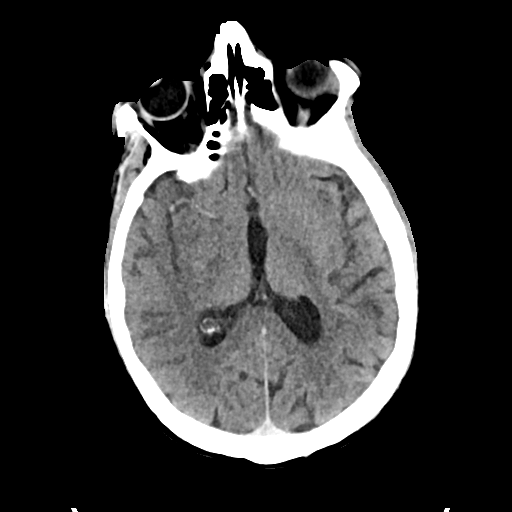
[im 24/38  brain]
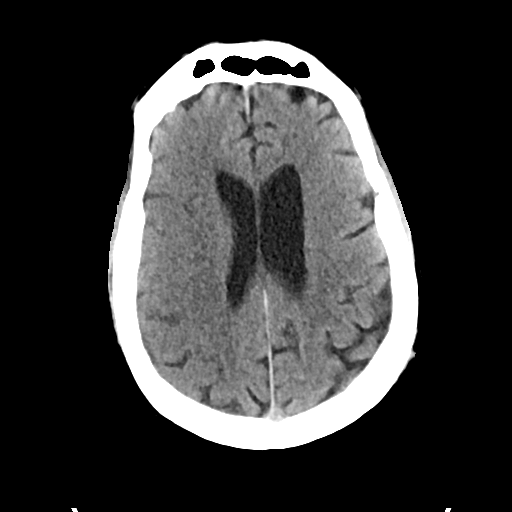
[im 24/38  bone]
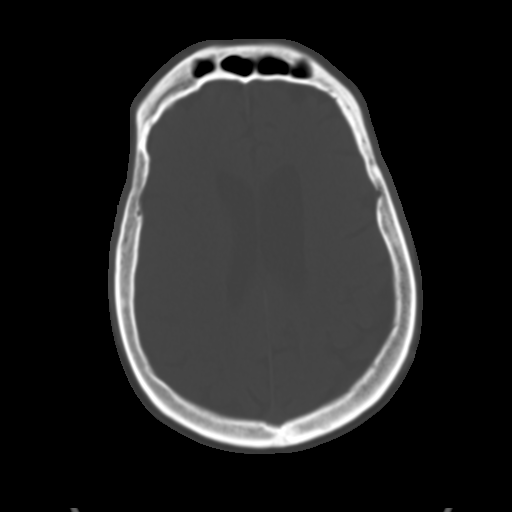
[im 28/38  brain]
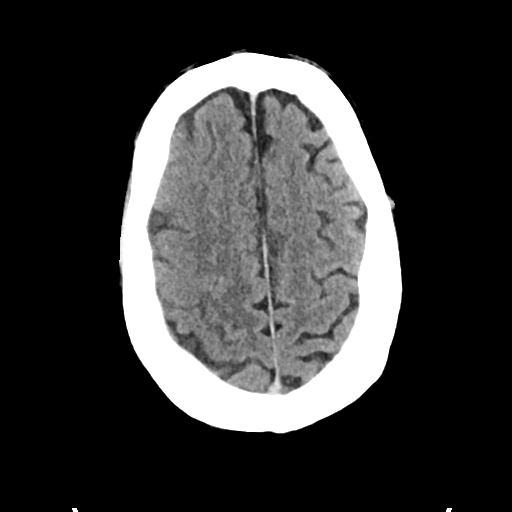
[im 33/38  brain]
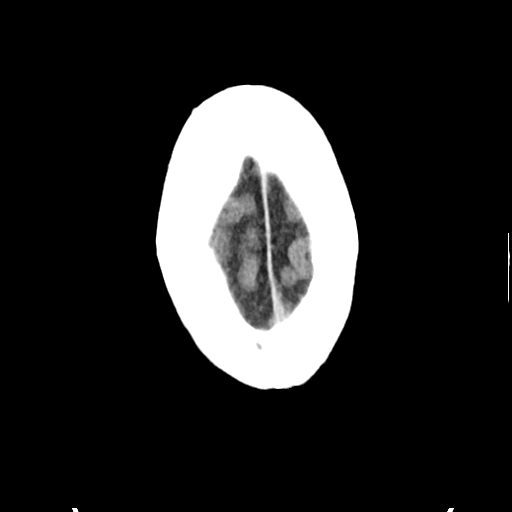

[Series 5: cor soft · coronal · 0.36mm/px · 3 of 73 slices shown]
[im 25/73  brain]
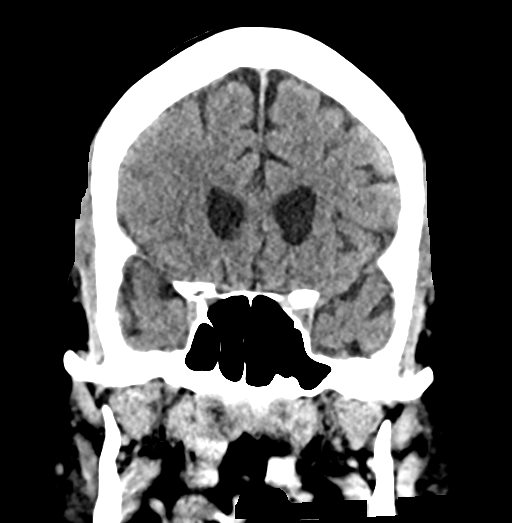
[im 33/73  brain]
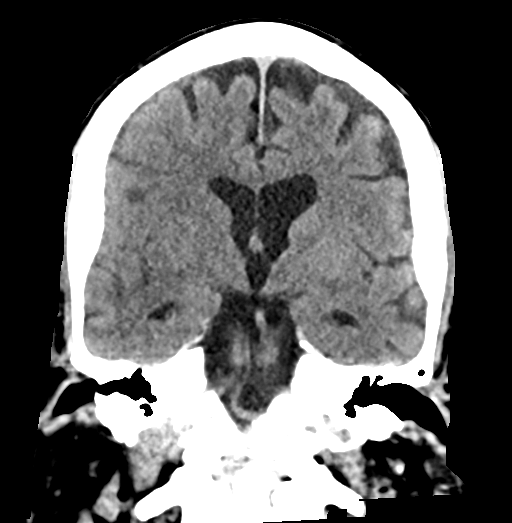
[im 41/73  brain]
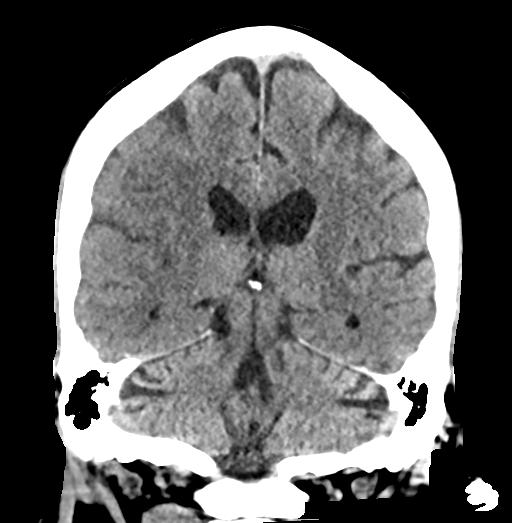

[Series 6: sag soft · sagittal · 0.37mm/px · 3 of 62 slices shown]
[im 21/62  brain]
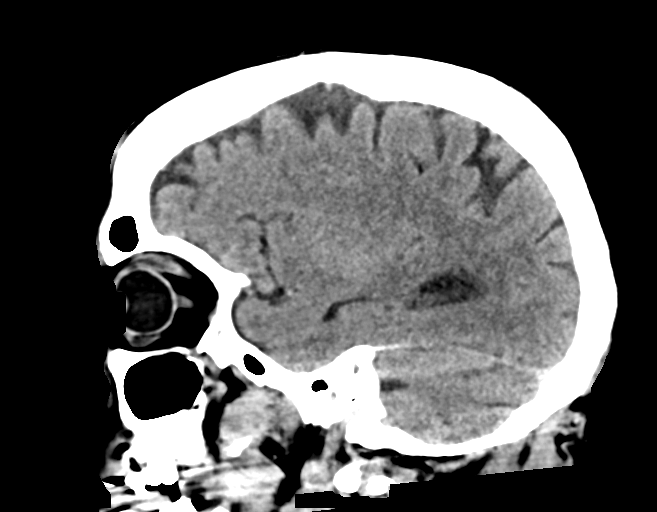
[im 31/62  brain]
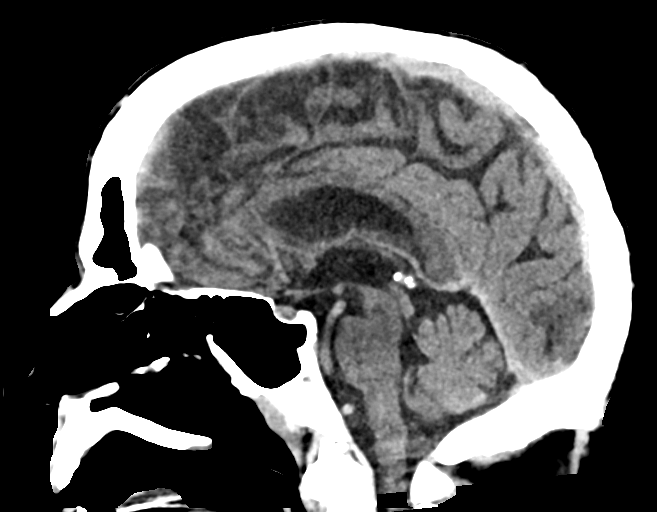
[im 41/62  brain]
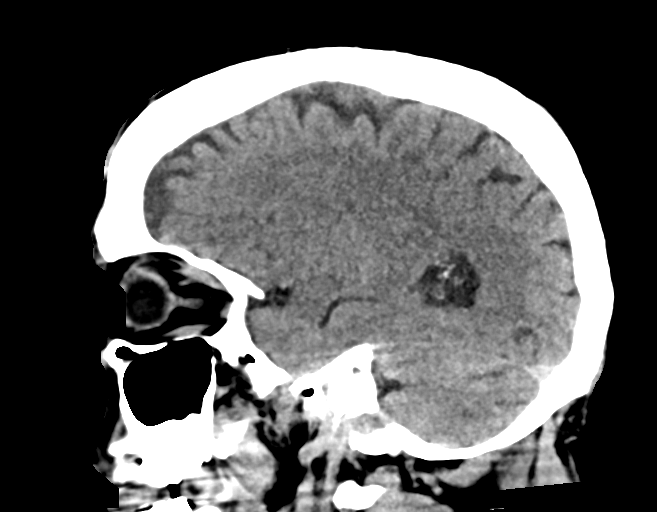

[16 of 47 positions shown; findings below may reference images not displayed]

FINDINGS: Brain: Atrophy with chronic microvascular ischemic disease. Small
chronic right thalamic infarct.

Acute to early subacute left PCA territory infarct again noted,
stable. Now evident is subtle fogging and loss of gray-white matter
differentiation through much of the right MCA territory, involving
the right insula, operculum, and supra ganglionic cortical gray
matter. Probable old subtle hypodensity within the gray matter of
the anterior right temporal lobe as well subtle hypodensity now
evident at the right caudate and lentiform nuclei and internal
capsule as well. No significant regional mass effect at this time.
No acute intracranial hemorrhage.

Remainder the brain is unchanged in appearance.

Vascular: Residual contrast material within the intracranial
circulation related to recent CTA. Scattered vascular calcifications
noted within the carotid siphons.

Skull: Scalp soft tissues and calvarium within normal limits.

Sinuses/Orbits: Globes and orbital soft tissues demonstrate no acute
finding. Right gaze noted. Paranasal sinuses and mastoid air cells
remain clear.

Other: None.

ASPECTS ([facility] Stroke Program Early CT Score)

- Ganglionic level infarction (caudate, lentiform nuclei, internal
capsule, insula, M1-M3 cortex): 2

- Supraganglionic infarction (M4-M6 cortex): 0

Total score (0-10 with 10 being normal): 3
IMPRESSION: 1. Large evolving right MCA territory ischemic infarct, now evident
as compared to recent CT from earlier the same day. No associated
intracranial hemorrhage.
2. ASPECTS is 2, previously 9.
3. Unchanged appearance of evolving acute to early subacute left PCA
territory infarct.

These results were communicated to Dr. Latifli at [DATE] pmon
04/10/2018by text page via the [employer] messaging system.
